# Patient Record
Sex: Male | Born: 1995 | Race: Black or African American | Hispanic: No | Marital: Single | Smoking: Never smoker
Health system: Southern US, Community
[De-identification: ages and names within clinical notes are randomized; demographics above are authoritative.]

## PROBLEM LIST (undated history)

## (undated) ENCOUNTER — Emergency Department (HOSPITAL_COMMUNITY): Admission: EM | Payer: Medicaid Other | Source: Home / Self Care

## (undated) DIAGNOSIS — J45909 Unspecified asthma, uncomplicated: Secondary | ICD-10-CM

## (undated) DIAGNOSIS — J302 Other seasonal allergic rhinitis: Secondary | ICD-10-CM

## (undated) DIAGNOSIS — F419 Anxiety disorder, unspecified: Secondary | ICD-10-CM

## (undated) HISTORY — PX: NO PAST SURGERIES: SHX2092

---
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
## 2015-03-29 ENCOUNTER — Emergency Department (HOSPITAL_COMMUNITY)
Admission: EM | Admit: 2015-03-29 | Discharge: 2015-03-29 | Disposition: A | Discharge status: Home / Self Care | Payer: Self-pay | Attending: Emergency Medicine | Admitting: Emergency Medicine

## 2015-03-29 ENCOUNTER — Encounter (HOSPITAL_COMMUNITY): Payer: Self-pay | Admitting: Emergency Medicine

## 2015-03-29 DIAGNOSIS — M62838 Other muscle spasm: Secondary | ICD-10-CM | POA: Insufficient documentation

## 2015-03-29 DIAGNOSIS — M549 Dorsalgia, unspecified: Secondary | ICD-10-CM

## 2015-03-29 DIAGNOSIS — S4992XA Unspecified injury of left shoulder and upper arm, initial encounter: Secondary | ICD-10-CM | POA: Insufficient documentation

## 2015-03-29 DIAGNOSIS — Y92410 Unspecified street and highway as the place of occurrence of the external cause: Secondary | ICD-10-CM | POA: Insufficient documentation

## 2015-03-29 DIAGNOSIS — Z79899 Other long term (current) drug therapy: Secondary | ICD-10-CM | POA: Insufficient documentation

## 2015-03-29 DIAGNOSIS — Y9389 Activity, other specified: Secondary | ICD-10-CM | POA: Insufficient documentation

## 2015-03-29 DIAGNOSIS — S199XXA Unspecified injury of neck, initial encounter: Secondary | ICD-10-CM | POA: Insufficient documentation

## 2015-03-29 DIAGNOSIS — Z880 Allergy status to penicillin: Secondary | ICD-10-CM | POA: Insufficient documentation

## 2015-03-29 DIAGNOSIS — S3992XA Unspecified injury of lower back, initial encounter: Secondary | ICD-10-CM | POA: Insufficient documentation

## 2015-03-29 DIAGNOSIS — Y998 Other external cause status: Secondary | ICD-10-CM | POA: Insufficient documentation

## 2015-03-29 MED ORDER — METHOCARBAMOL 500 MG PO TABS: 500.0000 mg | ORAL_TABLET | Freq: Two times a day (BID) | ORAL | Status: DC

## 2015-03-29 MED ORDER — NAPROXEN 500 MG PO TABS
500.0000 mg | ORAL_TABLET | Freq: Two times a day (BID) | ORAL | Status: DC
Start: 2015-03-29 — End: 2016-09-25
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
## 2016-02-01 ENCOUNTER — Emergency Department (HOSPITAL_COMMUNITY): Payer: Self-pay

## 2016-02-01 ENCOUNTER — Encounter (HOSPITAL_COMMUNITY): Payer: Self-pay | Admitting: Emergency Medicine

## 2016-02-01 DIAGNOSIS — F509 Eating disorder, unspecified: Secondary | ICD-10-CM | POA: Insufficient documentation

## 2016-02-01 DIAGNOSIS — R197 Diarrhea, unspecified: Secondary | ICD-10-CM | POA: Insufficient documentation

## 2016-02-01 DIAGNOSIS — R1110 Vomiting, unspecified: Secondary | ICD-10-CM | POA: Insufficient documentation

## 2016-02-01 LAB — URINALYSIS, ROUTINE W REFLEX MICROSCOPIC
BILIRUBIN URINE: NEGATIVE
GLUCOSE, UA: NEGATIVE mg/dL
HGB URINE DIPSTICK: NEGATIVE
KETONES UR: 40 mg/dL — AB
Leukocytes, UA: NEGATIVE
Nitrite: NEGATIVE
PH: 7 (ref 5.0–8.0)
Protein, ur: NEGATIVE mg/dL
SPECIFIC GRAVITY, URINE: 1.017 (ref 1.005–1.030)

## 2016-02-01 LAB — COMPREHENSIVE METABOLIC PANEL WITH GFR
ALT: 19 U/L (ref 17–63)
AST: 21 U/L (ref 15–41)
Albumin: 4.3 g/dL (ref 3.5–5.0)
Alkaline Phosphatase: 88 U/L (ref 38–126)
Anion gap: 12 (ref 5–15)
BUN: 10 mg/dL (ref 6–20)
CO2: 22 mmol/L (ref 22–32)
Calcium: 9.4 mg/dL (ref 8.9–10.3)
Chloride: 100 mmol/L — ABNORMAL LOW (ref 101–111)
Creatinine, Ser: 1.13 mg/dL (ref 0.61–1.24)
GFR calc Af Amer: >60 mL/min
GFR calc non Af Amer: >60 mL/min
Glucose, Bld: 100 mg/dL — ABNORMAL HIGH (ref 65–99)
Potassium: 3.5 mmol/L (ref 3.5–5.1)
Sodium: 134 mmol/L — ABNORMAL LOW (ref 135–145)
Total Bilirubin: 0.7 mg/dL (ref 0.3–1.2)
Total Protein: 8.4 g/dL — ABNORMAL HIGH (ref 6.5–8.1)

## 2016-02-01 LAB — CBC WITH DIFFERENTIAL/PLATELET
BASOS PCT: 0 %
Basophils Absolute: 0 10*3/uL (ref 0.0–0.1)
Eosinophils Absolute: 0 10*3/uL (ref 0.0–0.7)
Eosinophils Relative: 0 %
HEMATOCRIT: 44.5 % (ref 39.0–52.0)
HEMOGLOBIN: 15.4 g/dL (ref 13.0–17.0)
LYMPHS ABS: 0.9 10*3/uL (ref 0.7–4.0)
LYMPHS PCT: 23 %
MCH: 28.7 pg (ref 26.0–34.0)
MCHC: 34.6 g/dL (ref 30.0–36.0)
MCV: 83 fL (ref 78.0–100.0)
MONO ABS: 0.6 10*3/uL (ref 0.1–1.0)
MONOS PCT: 16 %
NEUTROS ABS: 2.3 10*3/uL (ref 1.7–7.7)
Neutrophils Relative %: 60 %
Platelets: 245 10*3/uL (ref 150–400)
RBC: 5.36 MIL/uL (ref 4.22–5.81)
RDW: 12.9 % (ref 11.5–15.5)
WBC: 3.8 10*3/uL — ABNORMAL LOW (ref 4.0–10.5)

## 2016-02-01 LAB — I-STAT CG4 LACTIC ACID, ED: Lactic Acid, Venous: 1.08 mmol/L (ref 0.5–2.0)

## 2016-02-01 NOTE — ED Notes (Signed)
Dr. Julieanne Cotton. Knott ( EDP ) and E. Manson PasseyBrown RN notified on pt.'s condition , no new order received from EDP

## 2016-02-01 NOTE — ED Notes (Signed)
Pt. presents with multiple complaints : fever , chills , body aches , numbness at torso , emesis and diarrhea onset 3 days ago , febrile at triage Tylenol taken 3 hours prior to arrival . Respirations unlabored.

## 2016-02-02 ENCOUNTER — Emergency Department (HOSPITAL_COMMUNITY)
Admission: EM | Admit: 2016-02-02 | Discharge: 2016-02-02 | Disposition: A | Discharge status: Home / Self Care | Payer: Self-pay | Attending: Emergency Medicine | Admitting: Emergency Medicine

## 2016-02-02 LAB — URINE CULTURE: CULTURE: NO GROWTH

## 2016-02-02 NOTE — ED Notes (Signed)
Pts name called for a room no answer 

## 2016-02-06 LAB — CULTURE, BLOOD (ROUTINE X 2)
CULTURE: NO GROWTH
Culture: NO GROWTH

## 2016-02-26 ENCOUNTER — Encounter (HOSPITAL_COMMUNITY): Payer: Self-pay | Admitting: *Deleted

## 2016-02-26 ENCOUNTER — Emergency Department (HOSPITAL_COMMUNITY)
Admission: EM | Admit: 2016-02-26 | Discharge: 2016-02-26 | Disposition: A | Discharge status: Home / Self Care | Payer: No Typology Code available for payment source | Attending: Emergency Medicine | Admitting: Emergency Medicine

## 2016-02-26 ENCOUNTER — Emergency Department (HOSPITAL_COMMUNITY): Payer: No Typology Code available for payment source

## 2016-02-26 DIAGNOSIS — I319 Disease of pericardium, unspecified: Secondary | ICD-10-CM | POA: Insufficient documentation

## 2016-02-26 DIAGNOSIS — J45909 Unspecified asthma, uncomplicated: Secondary | ICD-10-CM | POA: Insufficient documentation

## 2016-02-26 DIAGNOSIS — Z79899 Other long term (current) drug therapy: Secondary | ICD-10-CM | POA: Insufficient documentation

## 2016-02-26 DIAGNOSIS — Z791 Long term (current) use of non-steroidal anti-inflammatories (NSAID): Secondary | ICD-10-CM | POA: Insufficient documentation

## 2016-02-26 DIAGNOSIS — Z880 Allergy status to penicillin: Secondary | ICD-10-CM | POA: Insufficient documentation

## 2016-02-26 HISTORY — DX: Unspecified asthma, uncomplicated: J45.909

## 2016-02-26 MED ORDER — IBUPROFEN 800 MG PO TABS: 800.0000 mg | ORAL_TABLET | Freq: Three times a day (TID) | ORAL | Status: DC

## 2016-02-26 MED ORDER — IBUPROFEN 800 MG PO TABS
800.0000 mg | ORAL_TABLET | Freq: Once | ORAL | Status: AC
  Administered 2016-02-26: 800 mg via ORAL
  Filled 2016-02-26: qty 1

## 2016-02-26 MED ORDER — COLCHICINE 0.6 MG PO TABS: 0.6000 mg | ORAL_TABLET | Freq: Two times a day (BID) | ORAL | Status: DC

## 2016-02-26 MED ORDER — OMEPRAZOLE 20 MG PO CPDR: 20.0000 mg | DELAYED_RELEASE_CAPSULE | Freq: Every day | ORAL | Status: DC

## 2016-02-26 NOTE — ED Provider Notes (Signed)
CSN: [id number]     Arrival date & time 02/26/16  1454 History   First MD Initiated Contact with Patient 02/26/16 1655     Chief Complaint  Patient presents with  . Pain     (Consider location/radiation/quality/duration/timing/severity/associated sxs/prior Treatment) HPI Comments: Patient presents to the emergency department with chief complaint of chest pain. He states that he has been having left sided rib/chest pain for the past 1-2 weeks. He states the pain is worsened with deep breathing. It is worsened when he lies down. He states that he recently had the flu 3 weeks ago. He denies any other associated symptoms. Denies any fevers, chills, shortness of breath.   The history is provided by the patient. No language interpreter was used.    Past Medical History  Diagnosis Date  . Seasonal allergies   . Asthma    History reviewed. No pertinent past surgical history. History reviewed. No pertinent family history. Social History  Substance Use Topics  . Smoking status: Never Smoker   . Smokeless tobacco: None  . Alcohol Use: No    Review of Systems  Constitutional: Negative for fever and chills.  Respiratory: Negative for shortness of breath.   Cardiovascular: Negative for chest pain.  Gastrointestinal: Negative for nausea, vomiting, diarrhea and constipation.  Genitourinary: Negative for dysuria.  All other systems reviewed and are negative.     Allergies  Amoxicillin  Home Medications   Prior to Admission medications   Medication Sig Start Date End Date Taking? Authorizing Provider  albuterol (PROVENTIL HFA;VENTOLIN HFA) 108 (90 BASE) MCG/ACT inhaler Inhale 1-2 puffs into the lungs every 6 (six) hours as needed for wheezing or shortness of breath. 07/31/14   Loren Raceravid Yelverton, MD  diphenhydrAMINE (BENADRYL) 25 MG tablet Take 1 tablet (25 mg total) by mouth every 6 (six) hours as needed for itching. 07/31/14   Loren Raceravid Yelverton, MD  methocarbamol (ROBAXIN) 500 MG tablet  Take 1 tablet (500 mg total) by mouth 2 (two) times daily. 03/29/15   Garlon HatchetLisa M Sanders, PA-C  naproxen (NAPROSYN) 500 MG tablet Take 1 tablet (500 mg total) by mouth 2 (two) times daily with a meal. 03/29/15   Garlon HatchetLisa M Sanders, PA-C  predniSONE (DELTASONE) 20 MG tablet 3 tabs po day one, then 2 po daily x 4 days 07/31/14   Loren Raceravid Yelverton, MD   BP 131/72 mmHg  Pulse 88  Temp(Src) 98.6 F (37 C) (Oral)  Resp 16  SpO2 98% Physical Exam  Constitutional: He is oriented to person, place, and time. He appears well-developed and well-nourished.  HENT:  Head: Normocephalic and atraumatic.  Eyes: Conjunctivae and EOM are normal. Pupils are equal, round, and reactive to light. Right eye exhibits no discharge. Left eye exhibits no discharge. No scleral icterus.  Neck: Normal range of motion. Neck supple. No JVD present.  Cardiovascular: Normal rate, regular rhythm and normal heart sounds.  Exam reveals no gallop and no friction rub.   No murmur heard. Pulmonary/Chest: Effort normal and breath sounds normal. No respiratory distress. He has no wheezes. He has no rales. He exhibits no tenderness.  Abdominal: Soft. He exhibits no distension and no mass. There is no tenderness. There is no rebound and no guarding.  Musculoskeletal: Normal range of motion. He exhibits no edema or tenderness.  Neurological: He is alert and oriented to person, place, and time.  Skin: Skin is warm and dry.  Psychiatric: He has a normal mood and affect. His behavior is normal. Judgment and thought content  normal.  Nursing note and vitals reviewed.   ED Course  Procedures (including critical care time)  Imaging Review Dg Chest 2 View  02/26/2016  CLINICAL DATA:  20 year old male with left-sided chest pain EXAM: CHEST  2 VIEW COMPARISON:  None. FINDINGS: The lungs are clear and negative for focal airspace consolidation, pulmonary edema or suspicious pulmonary nodule. No pleural effusion or pneumothorax. Cardiac and mediastinal  contours are within normal limits. No acute fracture or lytic or blastic osseous lesions. The visualized upper abdominal bowel gas pattern is unremarkable. IMPRESSION: Negative chest x-ray. Electronically Signed   By: Malachy Moan M.D.   On: 02/26/2016 16:05   I have personally reviewed and evaluated these images and lab results as part of my medical decision-making.   EKG Interpretation None     ED ECG REPORT  I personally interpreted this EKG   Date: 02/26/2016   Rate: 76  Rhythm: normal sinus rhythm  QRS Axis: normal  Intervals: normal  ST/T Wave abnormalities: ST elevations diffusely  Conduction Disutrbances:none  Narrative Interpretation:   Old EKG Reviewed: none available   MDM   Final diagnoses:  Pericarditis    Patient with symptoms concerning for pericarditis. He has left-sided chest pain that is worsened with deep breathing and when he lies down. Symptoms improved when he sits up. He had recent influenza 3 weeks ago. Will check EKG. Chest x-ray is negative. Plan for treatment with anti-inflammatories. Vital signs are stable.  Patient discussed with Dr. Effie Shy, who agrees with treatment plan for pericarditis.  Patient does not have a PCP and will require close follow-up with cardiology.  Resources given.  Return precautions given.  VSS.  No other medical problems.  Doubt myocarditis.  Will start ibuprofen, colchicine, and omeprazole to protect the stomach.     Roxy Horseman, PA-C 02/26/16 1800  Mancel Bale, MD 02/26/16 [phone]

## 2016-02-26 NOTE — ED Notes (Signed)
Pt reports recent cough, has left side rib pain that increases with breathing, movement and palpation. Airway intact at triage.

## 2016-02-26 NOTE — ED Notes (Signed)
Pt not answering when called to transport to the room

## 2016-02-26 NOTE — Discharge Instructions (Signed)
Pericarditis °Pericarditis is swelling (inflammation) of the pericardium. The pericardium is a thin, double-layered, fluid-filled tissue sac that surrounds the heart. The purpose of the pericardium is to contain the heart in the chest cavity and keep the heart from overexpanding. Different types of pericarditis can occur, such as: °· Acute pericarditis. Inflammation can develop suddenly in acute pericarditis. °· Chronic pericarditis. Inflammation develops gradually and is long-lasting in chronic pericarditis. °· Constrictive pericarditis. In this type of pericarditis, the layers of the pericardium stiffen and develop scar tissue. The scar tissue thickens and sticks together. This makes it difficult for the heart to pump and work as it normally does. °CAUSES  °Pericarditis can be caused from different conditions, such as: °· A bacterial, fungal or viral infection. °· After a heart attack (myocardial infarction). °· After open-heart surgery (coronary bypass graft surgery). °· Auto-immune conditions such as lupus, rheumatoid arthritis or scleroderma. °· Kidney failure. °· Low thyroid condition (hypothyroidism). °· Cancer from another part of the body that has spread (metastasized) to the pericardium. °· Chest injury or trauma. °· After radiation treatment. °· Certain medicines. °SYMPTOMS  °Symptoms of pericarditis can include: °· Chest pain. Chest pain symptoms may increase when laying down and may be relieved when sitting up and leaning forward. °· A chronic, dry cough. °· Heart palpitations. These may feel like rapid, fluttering or pounding heart beats. °· Chest pain may be worse when swallowing. °· Dizziness or fainting. °· Tiredness, fatigue or lethargy. °· Fever. °DIAGNOSIS  °Pericarditis is diagnosed by the following: °· A physical exam. A heart sound called a pericardial friction rub may be heard when your caregiver listens to your heart. °· Blood work. Blood may be drawn to check for an infection and to look at  your blood chemistry. °· Electrocardiography. During electrocardiography your heart's electrical activity is monitored and recorded with a tracing on paper (electrocardiogram [ECG]). °· Echocardiography. °· Computed tomography (CT). °· Magnetic resonance image (MRI). °TREATMENT  °To treat pericarditis, it is important to know the cause of it. The cause of pericarditis determines the treatment.  °· If the cause of pericarditis is due to an infection, treatment is based on the type of infection. If an infection is suspected in the pericardial fluid, a procedure called a pericardial fluid culture and biopsy may be done. This takes a sample of the pericardial fluid. The sample is sent to a lab which runs tests on the pericardial fluid to check for an infection. °· If the autoimmune disease is the cause, treatment of the autoimmune condition will help improve the pericarditis. °· If the cause of pericarditis is not known, anti-inflammatory medicines may be used to help decrease the inflammation. °· Surgery may be needed. The following are types of surgeries or procedures that may be done to treat pericarditis: °¨ Pericardial window. A pericardial window makes a cut (incision) into the pericardial sac. This allows excess fluid in the pericardium to drain. °¨ Pericardiocentesis. A pericardiocentesis is also known as a pericardial tap. This procedure uses a needle that is guided by X-ray to drain (aspirate) excess fluid from the pericardium. °¨ Pericardiectomy. A pericardiectomy removes part or all of the pericardium. °HOME CARE INSTRUCTIONS  °· Do not smoke. If you smoke, quit. Your caregiver can help you quit smoking. °· Maintain a healthy weight. °· Follow an exercise program as directed by your health care provider. You may need to limit your exercising until your symptoms go away. °· If you drink alcohol, do so in moderation. °·   Eat a heart healthy diet. A registered dietitian can help you learn about healthy food  choices. °· Keep a list of all your medicines with you at all times. Include the name, dose, how often it is taken and how it is taken. °SEEK IMMEDIATE MEDICAL CARE IF:  °· You have chest pain or feelings of chest pressure. °· You have sweating (diaphoresis) when at rest. °· You have irregular heartbeats (palpitations). °· You have rapid, racing heart beats. °· You have unexplained fainting episodes. °· You feel sick to your stomach (nausea) or vomiting without cause. °· You have unexplained weakness. °If you develop any of the symptoms which originally made you seek care, call for local emergency medical help. Do not drive yourself to the hospital. °  °This information is not intended to replace advice given to you by your health care provider. Make sure you discuss any questions you have with your health care provider. °  °Document Released: 04/25/2001 Document Revised: 03/16/2015 Document Reviewed: 05/12/2015 °Elsevier Interactive Patient Education ©2016 [employer] ° °

## 2016-09-25 ENCOUNTER — Emergency Department (HOSPITAL_COMMUNITY): Payer: Self-pay

## 2016-09-25 ENCOUNTER — Emergency Department (HOSPITAL_COMMUNITY)
Admission: EM | Admit: 2016-09-25 | Discharge: 2016-09-25 | Disposition: A | Discharge status: Home / Self Care | Payer: Self-pay | Attending: Emergency Medicine | Admitting: Emergency Medicine

## 2016-09-25 ENCOUNTER — Encounter (HOSPITAL_COMMUNITY): Payer: Self-pay | Admitting: Emergency Medicine

## 2016-09-25 DIAGNOSIS — W1839XA Other fall on same level, initial encounter: Secondary | ICD-10-CM | POA: Insufficient documentation

## 2016-09-25 DIAGNOSIS — Y990 Civilian activity done for income or pay: Secondary | ICD-10-CM | POA: Insufficient documentation

## 2016-09-25 DIAGNOSIS — Y929 Unspecified place or not applicable: Secondary | ICD-10-CM | POA: Insufficient documentation

## 2016-09-25 DIAGNOSIS — M25561 Pain in right knee: Secondary | ICD-10-CM

## 2016-09-25 DIAGNOSIS — J45909 Unspecified asthma, uncomplicated: Secondary | ICD-10-CM | POA: Insufficient documentation

## 2016-09-25 DIAGNOSIS — Y9361 Activity, american tackle football: Secondary | ICD-10-CM | POA: Insufficient documentation

## 2016-09-25 DIAGNOSIS — Z79899 Other long term (current) drug therapy: Secondary | ICD-10-CM | POA: Insufficient documentation

## 2016-09-25 MED ORDER — IBUPROFEN 800 MG PO TABS: 800.0000 mg | ORAL_TABLET | Freq: Three times a day (TID) | ORAL | 0 refills | Status: DC

## 2016-09-25 NOTE — ED Triage Notes (Signed)
Pt sts right knee pain after injuring 3 weeks ago

## 2016-09-25 NOTE — ED Provider Notes (Signed)
[facility]-EMERGENCY DEPT Provider Note   CSN: [id number] Arrival date & time: 09/25/16  [id number]  By signing my name below, I, Sonum Patel, attest that this documentation has been prepared under the direction and in the presence of Roxy Horsemanobert Jelene Albano, PA-C. Electronically Signed: Sonum Patel, Neurosurgeoncribe. 09/25/16. 9:53 AM.  History   Chief Complaint Chief Complaint  Patient presents with  . Knee Pain     The history is provided by the patient. No language interpreter was used.     HPI Comments: Bryan LemmaZavaijwuan Kelly is a 20 y.o. male who presents to the Emergency Department complaining of constant right knee pain for the past 1 month after an injury. Patient states he was playing football when he jumped and landed on the right lower extremity, causing him to hyperextended his right knee. He states since then his right leg will often buckle when walking causing him to feel unstable when waling. He states last week his right knee buckled causing his pain to worsen.   Past Medical History:  Diagnosis Date  . Asthma   . Seasonal allergies     There are no active problems to display for this patient.   History reviewed. No pertinent surgical history.     Home Medications    Prior to Admission medications   Medication Sig Start Date End Date Taking? Authorizing Provider  albuterol (PROVENTIL HFA;VENTOLIN HFA) 108 (90 BASE) MCG/ACT inhaler Inhale 1-2 puffs into the lungs every 6 (six) hours as needed for wheezing or shortness of breath. 07/31/14   Loren Raceravid Yelverton, MD  colchicine 0.6 MG tablet Take 1 tablet (0.6 mg total) by mouth 2 (two) times daily. 02/26/16   Roxy Horsemanobert Fouad Taul, PA-C  diphenhydrAMINE (BENADRYL) 25 MG tablet Take 1 tablet (25 mg total) by mouth every 6 (six) hours as needed for itching. 07/31/14   Loren Raceravid Yelverton, MD  ibuprofen (ADVIL,MOTRIN) 800 MG tablet Take 1 tablet (800 mg total) by mouth 3 (three) times daily. 02/26/16   Roxy Horsemanobert Filiberto Wamble, PA-C  methocarbamol (ROBAXIN) 500 MG tablet  Take 1 tablet (500 mg total) by mouth 2 (two) times daily. 03/29/15   Garlon HatchetLisa M Sanders, PA-C  naproxen (NAPROSYN) 500 MG tablet Take 1 tablet (500 mg total) by mouth 2 (two) times daily with a meal. 03/29/15   Garlon HatchetLisa M Sanders, PA-C  omeprazole (PRILOSEC) 20 MG capsule Take 1 capsule (20 mg total) by mouth daily. 02/26/16   Roxy Horsemanobert Paiden Caraveo, PA-C  predniSONE (DELTASONE) 20 MG tablet 3 tabs po day one, then 2 po daily x 4 days 07/31/14   Loren Raceravid Yelverton, MD    Family History History reviewed. No pertinent family history.  Social History Social History  Substance Use Topics  . Smoking status: Never Smoker  . Smokeless tobacco: Not on file  . Alcohol use No     Allergies   Amoxicillin   Review of Systems Review of Systems  Musculoskeletal: Positive for arthralgias and gait problem.  Skin: Negative for wound.  Neurological: Negative for weakness and numbness.     Physical Exam Updated Vital Signs BP 129/90 (BP Location: Right Arm)   Pulse 72   Temp 98.2 F (36.8 C) (Oral)   Resp 20   Ht 6' (1.829 m)   Wt 250 lb (113.4 kg)   SpO2 100%   BMI 33.91 kg/m   Physical Exam  Physical Exam  Constitutional: Pt appears well-developed and well-nourished. No distress.  HENT:  Head: Normocephalic and atraumatic.  Eyes: Conjunctivae are normal.  Neck: Normal range of  motion.  Cardiovascular: Normal rate, regular rhythm and intact distal pulses.   Capillary refill < 3 sec  Pulmonary/Chest: Effort normal and breath sounds normal.  Musculoskeletal: Pt exhibits tenderness to palpation over anteromedial joint line. No bony abnormality of deformity.  Pt exhibits no edema.  ROM: 4/5 on right Neurological: Pt  is alert. Coordination normal.  Sensation 5/5 on right Strength 4/5 on right Skin: Skin is warm and dry. Pt is not diaphoretic.  No tenting of the skin  Psychiatric: Pt has a normal mood and affect.  Nursing note and vitals reviewed.   ED Treatments / Results  DIAGNOSTIC  STUDIES: Oxygen Saturation is 100% on RA, normal by my interpretation.    COORDINATION OF CARE: 9:55 AM Discussed treatment plan with pt at bedside and pt agreed to plan.    Labs (all labs ordered are listed, but only abnormal results are displayed) Labs Reviewed - No data to display  EKG  EKG Interpretation None       Radiology No results found.  Procedures Procedures (including critical care time)  Medications Ordered in ED Medications - No data to display   Initial Impression / Assessment and Plan / ED Course  I have reviewed the triage vital signs and the nursing notes.  Pertinent labs & imaging results that were available during my care of the patient were reviewed by me and considered in my medical decision making (see chart for details).  Clinical Course     Patient X-Ray negative for obvious fracture or dislocation.  Pt advised to follow up with orthopedics. Patient given knee immobilizer and crutches while in ED, conservative therapy recommended and discussed. Patient will be discharged home & is agreeable with above plan. Returns precautions discussed. Pt appears safe for discharge.   Final Clinical Impressions(s) / ED Diagnoses   Final diagnoses:  Acute pain of right knee    New Prescriptions Discharge Medication List as of 09/25/2016 10:23 AM    START taking these medications   Details  ibuprofen (ADVIL,MOTRIN) 800 MG tablet Take 1 tablet (800 mg total) by mouth 3 (three) times daily., Starting Mon 09/25/2016, Print       I personally performed the services described in this documentation, which was scribed in my presence. The recorded information has been reviewed and is accurate.      Roxy HorsemanRobert Krithik Mapel, PA-C 09/25/16 1155    Heide Scaleshristopher J Tegeler, MD 09/25/16 2120

## 2017-01-09 ENCOUNTER — Encounter (HOSPITAL_COMMUNITY): Payer: Self-pay | Admitting: Emergency Medicine

## 2017-01-09 ENCOUNTER — Emergency Department (HOSPITAL_COMMUNITY)
Admission: EM | Admit: 2017-01-09 | Discharge: 2017-01-09 | Disposition: A | Discharge status: Home / Self Care | Payer: Self-pay | Attending: Emergency Medicine | Admitting: Emergency Medicine

## 2017-01-09 DIAGNOSIS — Z79899 Other long term (current) drug therapy: Secondary | ICD-10-CM | POA: Insufficient documentation

## 2017-01-09 DIAGNOSIS — J45909 Unspecified asthma, uncomplicated: Secondary | ICD-10-CM | POA: Insufficient documentation

## 2017-01-09 DIAGNOSIS — R059 Cough, unspecified: Secondary | ICD-10-CM

## 2017-01-09 DIAGNOSIS — R05 Cough: Secondary | ICD-10-CM | POA: Insufficient documentation

## 2017-01-09 NOTE — ED Provider Notes (Signed)
[facility]PT Provider Note   CSN: [id number] Arrival date & time: 01/09/17  1119  By signing my name below, I, Roger Kelly, attest that this documentation has been prepared under the direction and in the presence of Draden Cottingham, [place]. Electronically Signed: Freida Busmaniana Kelly, Scribe. 01/09/2017. 1:06 PM.   History   Chief Complaint Chief Complaint  Patient presents with  . Asthma    The history is provided by the patient. No language interpreter was used.     HPI Comments:  Roger Kelly is a 21 y.o. male with a history of exercise induced asthma,  who presents to the Emergency Department complaining of chest tightness x 3-4 days. He notes associated dry cough that is worse when supine and intermittent episodes of SOB (none at this time). Pt has a h/o similar symptoms secondary to his asthma; states past episodes usually resolve after 1-2 days. He has used his albuterol inhaler without relief.  Pt denies postnasal drip, congestion, rhinorrhea, and sore throat. He also denies h/o GERD. No fever, chills, nausea or diarrhea.    Past Medical History:  Diagnosis Date  . Asthma   . Seasonal allergies     There are no active problems to display for this patient.   History reviewed. No pertinent surgical history.     Home Medications    Prior to Admission medications   Medication Sig Start Date End Date Taking? Authorizing Provider  albuterol (PROVENTIL HFA;VENTOLIN HFA) 108 (90 BASE) MCG/ACT inhaler Inhale 1-2 puffs into the lungs every 6 (six) hours as needed for wheezing or shortness of breath. 07/31/14   Loren Raceravid Yelverton, MD  colchicine 0.6 MG tablet Take 1 tablet (0.6 mg total) by mouth 2 (two) times daily. 02/26/16   Roxy Horsemanobert Browning, PA-C  diphenhydrAMINE (BENADRYL) 25 MG tablet Take 1 tablet (25 mg total) by mouth every 6 (six) hours as needed for itching. 07/31/14   Loren Raceravid Yelverton, MD  ibuprofen (ADVIL,MOTRIN) 800 MG tablet Take 1 tablet (800 mg total) by mouth  3 (three) times daily. 09/25/16   Roxy Horsemanobert Browning, PA-C  methocarbamol (ROBAXIN) 500 MG tablet Take 1 tablet (500 mg total) by mouth 2 (two) times daily. 03/29/15   Garlon HatchetLisa M Sanders, PA-C  omeprazole (PRILOSEC) 20 MG capsule Take 1 capsule (20 mg total) by mouth daily. 02/26/16   Roxy Horsemanobert Browning, PA-C  predniSONE (DELTASONE) 20 MG tablet 3 tabs po day one, then 2 po daily x 4 days 07/31/14   Loren Raceravid Yelverton, MD    Family History History reviewed. No pertinent family history.  Social History Social History  Substance Use Topics  . Smoking status: Never Smoker  . Smokeless tobacco: Never Used  . Alcohol use No     Allergies   Amoxicillin   Review of Systems Review of Systems  Constitutional: Negative for chills and fever.  HENT: Negative for congestion, postnasal drip, rhinorrhea and sore throat.   Respiratory: Positive for cough, chest tightness and shortness of breath.   Gastrointestinal: Negative for abdominal pain, nausea and vomiting.     Physical Exam Updated Vital Signs BP 122/71 (BP Location: Left Arm)   Pulse 82   Temp 98.2 F (36.8 C) (Oral)   Resp 16   Ht 6\' 2"  (1.88 m)   Wt 108.9 kg   SpO2 98%   BMI 30.81 kg/m   Physical Exam  Constitutional: He is oriented to person, place, and time. He appears well-developed and well-nourished. No distress.  Well appearing  HENT:  Head: Normocephalic and  atraumatic.  Right Ear: External ear normal.  Left Ear: External ear normal.  Nose: Nose normal.  Mouth/Throat: Oropharynx is clear and moist. No oropharyngeal exudate.  Oropharynx without evidence of redness or exudates. Tonsils without evidence of redness, swelling, or exudates. TM's appear normal with no evidence of bulging. EAC appear non erythematous and not swollen  Eyes: Conjunctivae and EOM are normal. Pupils are equal, round, and reactive to light.  Neck: Normal range of motion.  Normal ROM. No nuchal rigidity.   Cardiovascular: Normal rate, regular rhythm and  normal heart sounds.   Pulmonary/Chest: Effort normal and breath sounds normal. No respiratory distress. He has no wheezes. He has no rales.  Lungs CTA. No wheezing. No rales. No stridor. Normal work of breathing  Abdominal: Soft. He exhibits no distension. There is no tenderness. There is no rebound and no guarding.  Soft and nontender. No rebound. No guarding. Negative murphy's sign. No focal tenderness at McBurney's point. No CVA tenderness. No evidence of hernia  Neurological: He is alert and oriented to person, place, and time.  Skin: Skin is warm and dry.  Psychiatric: He has a normal mood and affect. His behavior is normal.  Nursing note and vitals reviewed.    ED Treatments / Results  DIAGNOSTIC STUDIES:  Oxygen Saturation is 98% on RA, normal by my interpretation.    COORDINATION OF CARE:  1:03 PM Discussed treatment plan with pt at bedside and pt agreed to plan.  Labs (all labs ordered are listed, but only abnormal results are displayed) Labs Reviewed - No data to display  EKG  EKG Interpretation None       Radiology No results found.  Procedures Procedures (including critical care time)  Medications Ordered in ED Medications - No data to display   Initial Impression / Assessment and Plan / ED Course  I have reviewed the triage vital signs and the nursing notes.  Pertinent labs & imaging results that were available during my care of the patient were reviewed by me and considered in my medical decision making (see chart for details).    Patient here with complaints of cough for 4 days. He states that is mild, random and dry. Worse with lying down. He denies any recent cold-like symptoms. No history of GERD. He denies postnasal drip. He denies symptoms of asthma. He is well-appearing, nontoxic in no apparent distress. Hemodynamically stable. Patient will be discharged with instructions to continue symptomatically treatment at home. He states he has albuterol at  home. I offered him [employer] and he declined.  He states he's had this before for several days and goes away. Patient instructed to follow up with a primary care provider. He currently does not have one and has been given Engineer, building services for proper follow-up.  Reasons to immediately return to the emergency department discussed. Patient is agreeable with assessment and plan. Patient verbalizes understanding.   Final Clinical Impressions(s) / ED Diagnoses   Final diagnoses:  Cough    New Prescriptions New Prescriptions   No medications on file   I personally performed the services described in this documentation, which was scribed in my presence. The recorded information has been reviewed and is accurate.    [address] [place], [place] 01/09/17 1324    Gerhard Munch, MD 01/09/17 2032

## 2017-01-09 NOTE — Discharge Instructions (Signed)
Please schedule appointment with a primary care provider regarding today's visit and 2-5 days.  Contact a health care provider if: You have new symptoms. You cough up pus. Your cough does not get better after 2-3 weeks, or your cough gets worse. You cannot control your cough with suppressant medicines and you are losing sleep. You develop pain that is getting worse or pain that is not controlled with pain medicines. You have a fever. You have unexplained weight loss. You have night sweats. Get help right away if: You cough up blood. You have difficulty breathing. Your heartbeat is very fast.

## 2017-01-09 NOTE — ED Notes (Signed)
Lungs diminished clear.

## 2017-01-09 NOTE — ED Triage Notes (Signed)
Pt from home with c/o SOB with exertion for the last 4 days with hx of the same but was never seen.  Pt states he had exercise induced asthma as a kid.  NAD, denies SOB at this time, ambulatory.

## 2018-01-20 ENCOUNTER — Other Ambulatory Visit: Payer: Self-pay

## 2018-01-20 ENCOUNTER — Emergency Department (HOSPITAL_COMMUNITY)
Admission: EM | Admit: 2018-01-20 | Discharge: 2018-01-20 | Disposition: A | Discharge status: Home / Self Care | Payer: Self-pay | Attending: Emergency Medicine | Admitting: Emergency Medicine

## 2018-01-20 ENCOUNTER — Encounter (HOSPITAL_COMMUNITY): Payer: Self-pay | Admitting: Emergency Medicine

## 2018-01-20 DIAGNOSIS — R110 Nausea: Secondary | ICD-10-CM

## 2018-01-20 DIAGNOSIS — R059 Cough, unspecified: Secondary | ICD-10-CM

## 2018-01-20 DIAGNOSIS — Z79899 Other long term (current) drug therapy: Secondary | ICD-10-CM | POA: Insufficient documentation

## 2018-01-20 DIAGNOSIS — R05 Cough: Secondary | ICD-10-CM | POA: Insufficient documentation

## 2018-01-20 DIAGNOSIS — R0981 Nasal congestion: Secondary | ICD-10-CM | POA: Insufficient documentation

## 2018-01-20 DIAGNOSIS — J45909 Unspecified asthma, uncomplicated: Secondary | ICD-10-CM | POA: Insufficient documentation

## 2018-01-20 DIAGNOSIS — Z209 Contact with and (suspected) exposure to unspecified communicable disease: Secondary | ICD-10-CM | POA: Insufficient documentation

## 2018-01-20 DIAGNOSIS — R112 Nausea with vomiting, unspecified: Secondary | ICD-10-CM | POA: Insufficient documentation

## 2018-01-20 LAB — CBC
HEMATOCRIT: 45.1 % (ref 39.0–52.0)
HEMOGLOBIN: 15.8 g/dL (ref 13.0–17.0)
MCH: 30.4 pg (ref 26.0–34.0)
MCHC: 35 g/dL (ref 30.0–36.0)
MCV: 86.9 fL (ref 78.0–100.0)
Platelets: 338 10*3/uL (ref 150–400)
RBC: 5.19 MIL/uL (ref 4.22–5.81)
RDW: 12.8 % (ref 11.5–15.5)
WBC: 5.8 10*3/uL (ref 4.0–10.5)

## 2018-01-20 LAB — LIPASE, BLOOD: LIPASE: 21 U/L (ref 11–51)

## 2018-01-20 LAB — URINALYSIS, ROUTINE W REFLEX MICROSCOPIC
BILIRUBIN URINE: NEGATIVE
GLUCOSE, UA: NEGATIVE mg/dL
HGB URINE DIPSTICK: NEGATIVE
Ketones, ur: NEGATIVE mg/dL
Leukocytes, UA: NEGATIVE
Nitrite: NEGATIVE
PH: 7 (ref 5.0–8.0)
Protein, ur: NEGATIVE mg/dL
SPECIFIC GRAVITY, URINE: 1.015 (ref 1.005–1.030)

## 2018-01-20 LAB — COMPREHENSIVE METABOLIC PANEL
ALK PHOS: 94 U/L (ref 38–126)
ALT: 16 U/L — ABNORMAL LOW (ref 17–63)
ANION GAP: 10 (ref 5–15)
AST: 15 U/L (ref 15–41)
Albumin: 4.1 g/dL (ref 3.5–5.0)
BUN: <5 mg/dL — ABNORMAL LOW (ref 6–20)
CHLORIDE: 103 mmol/L (ref 101–111)
CO2: 21 mmol/L — AB (ref 22–32)
Calcium: 9.3 mg/dL (ref 8.9–10.3)
Creatinine, Ser: 0.89 mg/dL (ref 0.61–1.24)
GFR calc non Af Amer: >60 mL/min (ref >60)
GLUCOSE: 118 mg/dL — AB (ref 65–99)
Potassium: 3.5 mmol/L (ref 3.5–5.1)
SODIUM: 134 mmol/L — AB (ref 135–145)
Total Bilirubin: 0.3 mg/dL (ref 0.3–1.2)
Total Protein: 7.6 g/dL (ref 6.5–8.1)

## 2018-01-20 MED ORDER — PREDNISONE 10 MG (21) PO TBPK: ORAL_TABLET | ORAL | 0 refills | Status: DC

## 2018-01-20 MED ORDER — BENZONATATE 100 MG PO CAPS: 100.0000 mg | ORAL_CAPSULE | Freq: Three times a day (TID) | ORAL | 0 refills | Status: DC

## 2018-01-20 MED ORDER — ONDANSETRON 4 MG PO TBDP: 4.0000 mg | ORAL_TABLET | Freq: Three times a day (TID) | ORAL | 0 refills | Status: DC | PRN

## 2018-01-20 NOTE — Discharge Instructions (Addendum)
Your symptoms are consistent with a viral illness. Viruses do not require antibiotics. Treatment is symptomatic care and it is important to note that these symptoms may last for 7-14 days.   Hand washing: Wash your hands throughout the day, but especially before and after touching the face, using the restroom, sneezing, coughing, or touching surfaces that have been coughed or sneezed upon. Hydration: Symptoms will be intensified and complicated by dehydration. Dehydration can also extend the duration of symptoms. Drink plenty of fluids and get plenty of rest. You should be drinking at least half a liter of water an hour to stay hydrated. Electrolyte drinks (ex. Gatorade, Powerade, Pedialyte) are also encouraged. You should be drinking enough fluids to make your urine light yellow, almost clear. If this is not the case, you are not drinking enough water. Please note that some of the treatments indicated below will not be effective if you are not adequately hydrated. Diet: Please concentrate on hydration, however, you may introduce food slowly.  Start with a clear liquid diet, progressed to a full liquid diet, and then bland solids as you are able. Pain or fever: Ibuprofen, Naproxen, or Tylenol for pain or fever.  Nausea/vomiting: Use the Zofran for nausea or vomiting.  Cough: Use the Tessalon for cough.  Albuterol: May use the albuterol as needed for instances of shortness of breath. Prednisone: Take the prednisone, as directed, in its entirety. Zyrtec or Claritin: May add these medication daily to control underlying symptoms of congestion, sneezing, and other signs of allergies. Flonase: Use this medication, as directed, for nasal and sinus congestion. Congestion: Plain Mucinex may help relieve congestion. Saline sinus rinses and saline nasal sprays may also help relieve congestion. If you do not have heart problems or an allergy to such medications, you may also try phenylephrine or Sudafed. Sore  throat: Warm liquids or Chloraseptic spray may help soothe a sore throat. Gargle twice a day with a salt water solution made from a half teaspoon of salt in a cup of warm water.  Follow up: Follow up with a primary care provider, as needed, for any future management of this issue.

## 2018-01-20 NOTE — ED Provider Notes (Signed)
[facility] EMERGENCY DEPARTMENT Provider Note   CSN: [id number] Arrival date & time: 01/20/18  1055     History   Chief Complaint Chief Complaint  Patient presents with  . Nausea  . Emesis    HPI Roger Kelly is a 22 y.o. male.  HPI   Roger Kelly is a 22 y.o. male, with a history of asthma and seasonal allergies, presenting to the ED with nonproductive cough for last two days. Also endorses nausea and nasal congestion.  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 Denies vomiting, diarrhea, rash, sore throat, chest pain, shortness of breath, abdominal pain, fever/chills, or any other complaints.   Uses his inhaler once a day or every other day.  States he does not need a refill.  Past Medical History:  Diagnosis Date  . Asthma   . Seasonal allergies     There are no active problems to display for this patient.   History reviewed. No pertinent surgical history.     Home Medications    Prior to Admission medications   Medication Sig Start Date End Date Taking? Authorizing Provider  albuterol (PROVENTIL HFA;VENTOLIN HFA) 108 (90 BASE) MCG/ACT inhaler Inhale 1-2 puffs into the lungs every 6 (six) hours as needed for wheezing or shortness of breath. 07/31/14   Loren Racer, MD  benzonatate (TESSALON) 100 MG capsule Take 1 capsule (100 mg total) by mouth every 8 (eight) hours. 01/20/18   Taylia Berber C, PA-C  colchicine 0.6 MG tablet Take 1 tablet (0.6 mg total) by mouth 2 (two) times daily. 02/26/16   Roxy Horseman, PA-C  diphenhydrAMINE (BENADRYL) 25 MG tablet Take 1 tablet (25 mg total) by mouth every 6 (six) hours as needed for itching. 07/31/14   Loren Racer, MD  ibuprofen (ADVIL,MOTRIN) 800 MG tablet Take 1 tablet (800 mg total) by mouth 3 (three) times daily. 09/25/16   Roxy Horseman, PA-C  methocarbamol  (ROBAXIN) 500 MG tablet Take 1 tablet (500 mg total) by mouth 2 (two) times daily. 03/29/15   Garlon Hatchet, PA-C  omeprazole (PRILOSEC) 20 MG capsule Take 1 capsule (20 mg total) by mouth daily. 02/26/16   Roxy Horseman, PA-C  ondansetron (ZOFRAN ODT) 4 MG disintegrating tablet Take 1 tablet (4 mg total) by mouth every 8 (eight) hours as needed for nausea or vomiting. 01/20/18   Kayly Kriegel C, PA-C  predniSONE (STERAPRED UNI-PAK 21 TAB) 10 MG (21) TBPK tablet Take 6 tabs (60mg ) on day 1, 5 tabs (50mg ) on day 2, 4 tabs (40mg ) on day 3, 3 tabs (30mg ) on day 4, 2 tabs (20mg ) on day 5, and 1 tab (10mg ) on day 6. 01/20/18   Leylanie Woodmansee, Hillard Danker, PA-C    Family History No family history on file.  Social History Social History   Tobacco Use  . Smoking status: Never Smoker  . Smokeless tobacco: Never Used  Substance Use Topics  . Alcohol use: No  . Drug use: Yes    Types: Marijuana     Allergies   Amoxicillin   Review of Systems Review of Systems  Constitutional: Negative for chills, diaphoresis and fever.  HENT: Positive for congestion and sneezing. Negative for sinus pressure, sinus pain and sore throat.   Respiratory: Positive for cough. Negative for shortness of breath.   Cardiovascular: Negative for chest pain.  Gastrointestinal: Positive for nausea. Negative for abdominal  pain, diarrhea and vomiting.  Neurological: Negative for weakness and headaches.  All other systems reviewed and are negative.    Physical Exam Updated Vital Signs BP 120/77 (BP Location: Right Arm)   Pulse 78   Temp 98.7 F (37.1 C) (Oral)   Resp 16   Ht 6\' 2"  (1.88 m)   Wt 108.9 kg (240 lb)   SpO2 100%   BMI 30.81 kg/m   Physical Exam  Constitutional: He appears well-developed and well-nourished. No distress.  HENT:  Head: Normocephalic and atraumatic.  Eyes: Conjunctivae are normal.  Neck: Neck supple.  Cardiovascular: Normal rate, regular rhythm, normal heart sounds and intact distal pulses.    Pulmonary/Chest: Effort normal and breath sounds normal. No respiratory distress.  No increased work of breathing noted.  Speaks in full sentences without difficulty.  Abdominal: Soft. There is no tenderness. There is no guarding.  Musculoskeletal: He exhibits no edema.  Lymphadenopathy:    He has no cervical adenopathy.  Neurological: He is alert.  Skin: Skin is warm and dry. He is not diaphoretic.  Psychiatric: He has a normal mood and affect. His behavior is normal.  Nursing note and vitals reviewed.    ED Treatments / Results  Labs (all labs ordered are listed, but only abnormal results are displayed) Labs Reviewed  COMPREHENSIVE METABOLIC PANEL - Abnormal; Notable for the following components:      Result Value   Sodium 134 (*)    CO2 21 (*)    Glucose, Bld 118 (*)    BUN <5 (*)    ALT 16 (*)    All other components within normal limits  LIPASE, BLOOD  CBC  URINALYSIS, ROUTINE W REFLEX MICROSCOPIC    EKG  EKG Interpretation None       Radiology No results found.  Procedures Procedures (including critical care time)  Medications Ordered in ED Medications - No data to display   Initial Impression / Assessment and Plan / ED Course  I have reviewed the triage vital signs and the nursing notes.  Pertinent labs & imaging results that were available during my care of the patient were reviewed by me and considered in my medical decision making (see chart for details).     Patient presents with cough, congestion, and nausea.  Suspect lab abnormalities are due to minor dehydration.  This was discussed with the patient. Patient is nontoxic appearing, afebrile, not tachycardic, not tachypneic, not hypotensive, maintains SPO2 of 100% on room air, and is in no apparent distress. PCP follow-up as needed. The patient was given instructions for home care as well as return precautions. Patient voices understanding of these instructions, accepts the plan, and is comfortable  with discharge.   Vitals:   01/20/18 1108 01/20/18 1300  BP: 120/77 104/86  Pulse: 78 69  Resp: 16   Temp: 98.7 F (37.1 C)   TempSrc: Oral   SpO2: 100% 100%  Weight: 108.9 kg (240 lb)   Height: 6\' 2"  (1.88 m)      Final Clinical Impressions(s) / ED Diagnoses   Final diagnoses:  Cough  Nausea    ED Discharge Orders        Ordered    benzonatate (TESSALON) 100 MG capsule  Every 8 hours     01/20/18 1323    ondansetron (ZOFRAN ODT) 4 MG disintegrating tablet  Every 8 hours PRN     01/20/18 1323    predniSONE (STERAPRED UNI-PAK 21 TAB) 10 MG (21) TBPK tablet  01/20/18 1323       Anselm PancoastJoy, Dezaray Shibuya C, PA-C 01/20/18 1325    Azalia Bilisampos, Kevin, MD 01/21/18 1202

## 2018-01-20 NOTE — ED Triage Notes (Signed)
Pt. Stated, Roger Atlasve been sick for 2 days vomiting and nausea. I forced myself to eat yesterday abut then I vomited

## 2018-04-13 ENCOUNTER — Emergency Department (HOSPITAL_COMMUNITY): Payer: Self-pay

## 2018-04-13 ENCOUNTER — Other Ambulatory Visit: Payer: Self-pay

## 2018-04-13 ENCOUNTER — Encounter (HOSPITAL_COMMUNITY): Payer: Self-pay | Admitting: Emergency Medicine

## 2018-04-13 ENCOUNTER — Emergency Department (HOSPITAL_COMMUNITY)
Admission: EM | Admit: 2018-04-13 | Discharge: 2018-04-13 | Disposition: A | Discharge status: Home / Self Care | Payer: Self-pay | Attending: Emergency Medicine | Admitting: Emergency Medicine

## 2018-04-13 DIAGNOSIS — Z5321 Procedure and treatment not carried out due to patient leaving prior to being seen by health care provider: Secondary | ICD-10-CM | POA: Insufficient documentation

## 2018-04-13 DIAGNOSIS — R079 Chest pain, unspecified: Secondary | ICD-10-CM | POA: Insufficient documentation

## 2018-04-13 LAB — CBC
HCT: 42.4 % (ref 39.0–52.0)
Hemoglobin: 15 g/dL (ref 13.0–17.0)
MCH: 29.4 pg (ref 26.0–34.0)
MCHC: 35.4 g/dL (ref 30.0–36.0)
MCV: 83 fL (ref 78.0–100.0)
Platelets: 358 10*3/uL (ref 150–400)
RBC: 5.11 MIL/uL (ref 4.22–5.81)
RDW: 12.7 % (ref 11.5–15.5)
WBC: 14.6 10*3/uL — ABNORMAL HIGH (ref 4.0–10.5)

## 2018-04-13 LAB — I-STAT TROPONIN, ED: TROPONIN I, POC: 0.01 ng/mL (ref 0.00–0.08)

## 2018-04-13 LAB — BASIC METABOLIC PANEL
Anion gap: 13 (ref 5–15)
BUN: 7 mg/dL (ref 6–20)
CALCIUM: 9.8 mg/dL (ref 8.9–10.3)
CO2: 21 mmol/L — ABNORMAL LOW (ref 22–32)
Chloride: 102 mmol/L (ref 101–111)
Creatinine, Ser: 1.04 mg/dL (ref 0.61–1.24)
GFR calc Af Amer: >60 mL/min (ref >60)
Glucose, Bld: 120 mg/dL — ABNORMAL HIGH (ref 65–99)
Potassium: 3.2 mmol/L — ABNORMAL LOW (ref 3.5–5.1)
Sodium: 136 mmol/L (ref 135–145)

## 2018-04-13 MED ORDER — ALBUTEROL SULFATE (2.5 MG/3ML) 0.083% IN NEBU
5.0000 mg | INHALATION_SOLUTION | Freq: Once | RESPIRATORY_TRACT | Status: AC
  Administered 2018-04-13: 5 mg via RESPIRATORY_TRACT
  Filled 2018-04-13: qty 6

## 2018-04-13 NOTE — ED Notes (Signed)
Patient called for vital signs re-check, no answer.  

## 2018-04-13 NOTE — ED Triage Notes (Signed)
Reports waking from sleep with central chest pressure, sob, nausea.  Hx of asthma.  Also c/o his whole body feeling numb.

## 2018-04-13 NOTE — ED Notes (Signed)
No answer when called for room x 3 

## 2018-04-13 NOTE — ED Notes (Signed)
No answer when called for treatment room x1 

## 2018-04-15 NOTE — ED Notes (Signed)
Follow up call made  No answer   1100  04/15/18  s Lowry Bala rn

## 2019-04-17 ENCOUNTER — Other Ambulatory Visit: Payer: Self-pay

## 2019-04-17 ENCOUNTER — Emergency Department (HOSPITAL_COMMUNITY)
Admission: EM | Admit: 2019-04-17 | Discharge: 2019-04-17 | Discharge status: Against Medical Advice | Payer: Self-pay | Attending: Emergency Medicine | Admitting: Emergency Medicine

## 2019-04-17 ENCOUNTER — Encounter (HOSPITAL_COMMUNITY): Payer: Self-pay | Admitting: *Deleted

## 2019-04-17 DIAGNOSIS — Z5321 Procedure and treatment not carried out due to patient leaving prior to being seen by health care provider: Secondary | ICD-10-CM | POA: Insufficient documentation

## 2019-04-17 HISTORY — DX: Anxiety disorder, unspecified: F41.9

## 2019-04-17 LAB — BASIC METABOLIC PANEL
Anion gap: 9 (ref 5–15)
BUN: <5 mg/dL — ABNORMAL LOW (ref 6–20)
CO2: 23 mmol/L (ref 22–32)
Calcium: 10.1 mg/dL (ref 8.9–10.3)
Chloride: 106 mmol/L (ref 98–111)
Creatinine, Ser: 0.98 mg/dL (ref 0.61–1.24)
GFR calc Af Amer: >60 mL/min (ref >60)
GFR calc non Af Amer: >60 mL/min (ref >60)
Glucose, Bld: 96 mg/dL (ref 70–99)
Potassium: 3.6 mmol/L (ref 3.5–5.1)
Sodium: 138 mmol/L (ref 135–145)

## 2019-04-17 LAB — CBC
HCT: 47.7 % (ref 39.0–52.0)
Hemoglobin: 16.2 g/dL (ref 13.0–17.0)
MCH: 29.9 pg (ref 26.0–34.0)
MCHC: 34 g/dL (ref 30.0–36.0)
MCV: 88 fL (ref 80.0–100.0)
Platelets: 311 10*3/uL (ref 150–400)
RBC: 5.42 MIL/uL (ref 4.22–5.81)
RDW: 13.1 % (ref 11.5–15.5)
WBC: 6.9 10*3/uL (ref 4.0–10.5)
nRBC: 0 % (ref 0.0–0.2)

## 2019-04-17 NOTE — ED Triage Notes (Signed)
Per EMS, pt had syncopal episode possibly d/t anxiety.  He was transported from work.  Syncope was witnessed by a by stander.  He is A&O x 4.  Hx of anxiety and syncope in the past per his family.  Pt's mother reported pt have been under a lot of stress recently.  Reports thoracic pain.  C-collar in place per EMS.  ROM + in all extremities.

## 2019-04-17 NOTE — ED Notes (Signed)
Pt states he wants to leave, his mother is on her way to pick him  Up.  He's made aware that he has not been medically cleared to be discharged.  Pt verbalized understanding.

## 2019-05-09 ENCOUNTER — Emergency Department (HOSPITAL_COMMUNITY): Payer: Self-pay

## 2019-05-09 ENCOUNTER — Emergency Department (HOSPITAL_COMMUNITY)
Admission: EM | Admit: 2019-05-09 | Discharge: 2019-05-09 | Disposition: A | Discharge status: Home / Self Care | Payer: Self-pay | Attending: Emergency Medicine | Admitting: Emergency Medicine

## 2019-05-09 ENCOUNTER — Other Ambulatory Visit: Payer: Self-pay

## 2019-05-09 ENCOUNTER — Encounter (HOSPITAL_COMMUNITY): Payer: Self-pay

## 2019-05-09 DIAGNOSIS — S62309A Unspecified fracture of unspecified metacarpal bone, initial encounter for closed fracture: Secondary | ICD-10-CM

## 2019-05-09 DIAGNOSIS — Y998 Other external cause status: Secondary | ICD-10-CM | POA: Insufficient documentation

## 2019-05-09 DIAGNOSIS — S62304A Unspecified fracture of fourth metacarpal bone, right hand, initial encounter for closed fracture: Secondary | ICD-10-CM | POA: Insufficient documentation

## 2019-05-09 DIAGNOSIS — Y939 Activity, unspecified: Secondary | ICD-10-CM | POA: Insufficient documentation

## 2019-05-09 DIAGNOSIS — W228XXA Striking against or struck by other objects, initial encounter: Secondary | ICD-10-CM | POA: Insufficient documentation

## 2019-05-09 DIAGNOSIS — J45909 Unspecified asthma, uncomplicated: Secondary | ICD-10-CM | POA: Insufficient documentation

## 2019-05-09 DIAGNOSIS — S62306A Unspecified fracture of fifth metacarpal bone, right hand, initial encounter for closed fracture: Secondary | ICD-10-CM | POA: Insufficient documentation

## 2019-05-09 DIAGNOSIS — Y929 Unspecified place or not applicable: Secondary | ICD-10-CM | POA: Insufficient documentation

## 2019-05-09 DIAGNOSIS — F1210 Cannabis abuse, uncomplicated: Secondary | ICD-10-CM | POA: Insufficient documentation

## 2019-05-09 MED ORDER — OXYCODONE-ACETAMINOPHEN 5-325 MG PO TABS
2.0000 | ORAL_TABLET | Freq: Once | ORAL | Status: AC
  Administered 2019-05-09: 03:00:00 2 via ORAL
  Filled 2019-05-09: qty 2

## 2019-05-09 MED ORDER — OXYCODONE-ACETAMINOPHEN 5-325 MG PO TABS: 1.0000 | ORAL_TABLET | Freq: Four times a day (QID) | ORAL | 0 refills | Status: DC | PRN

## 2019-05-09 NOTE — ED Triage Notes (Signed)
Pt reports he was in an argument and swinging his arms around. Swung his right hand into a door. Hand appears swollen and sore.

## 2019-05-09 NOTE — ED Provider Notes (Signed)
Emergency Department Provider Note   I have reviewed the triage vital signs and the nursing notes.   HISTORY  Chief Complaint Hand Injury   HPI Roger Kelly is a 23 y.o. male w/o significant PMHx presents with right hand pain for the last couple hours after hitting it on a door frame while swinging arms around. No trauma elsewhere. Denies hitting person and no open skin   No other associated or modifying symptoms.    Past Medical History:  Diagnosis Date  . Anxiety   . Asthma   . Seasonal allergies     There are no active problems to display for this patient.   History reviewed. No pertinent surgical history.  Current Outpatient Rx  . Order #: [id number] Class: Print  . Order #: [id number] Class: Print  . Order #: [id number] Class: Print  . Order #: [id number] Class: Print  . Order #: [id number] Class: Print  . Order #: [id number] Class: Print  . Order #: [id number] Class: Print  . Order #: [id number] Class: Print  . Order #: [id number] Class: Print  . Order #: [id number] Class: Print    Allergies Amoxicillin  History reviewed. No pertinent family history.  Social History Social History   Tobacco Use  . Smoking status: Never Smoker  . Smokeless tobacco: Never Used  Substance Use Topics  . Alcohol use: No  . Drug use: Yes    Types: Marijuana    Review of Systems  All other systems negative except as documented in the HPI. All pertinent positives and negatives as reviewed in the HPI. ____________________________________________   PHYSICAL EXAM:  VITAL SIGNS: ED Triage Vitals  Enc Vitals Group     BP 05/09/19 0210 (!) 149/84     Pulse Rate 05/09/19 0210 77     Resp 05/09/19 0210 16     Temp 05/09/19 0210 99.1 F (37.3 C)     Temp src --      SpO2 05/09/19 0210 100 %    Constitutional: Alert and oriented. Well appearing and in no acute distress. Eyes: Conjunctivae are normal. PERRL. EOMI. Head: Atraumatic. Nose: No congestion/rhinnorhea.  Mouth/Throat: Mucous membranes are moist.  Oropharynx non-erythematous. Neck: No stridor.  No meningeal signs.   Cardiovascular: Normal rate, regular rhythm. Good peripheral circulation. Grossly normal heart sounds.   Respiratory: Normal respiratory effort.  No retractions. Lungs CTAB. Gastrointestinal: Soft and nontender. No distention.  Musculoskeletal: diffuse swelling, ttp and decreased ROM of right 4/5 MC areas.  Neurologic:  Normal speech and language. No gross focal neurologic deficits are appreciated.  Skin:  Skin is warm, dry and intact. No rash noted.   ____________________________________________   RADIOLOGY  Dg Hand Complete Right  Result Date: 05/09/2019 CLINICAL DATA:  Right hand slammed in door. EXAM: RIGHT HAND - COMPLETE 3+ VIEW COMPARISON:  None. FINDINGS: There are transverse fractures of the proximal fourth and fifth metacarpals. Both fractures are minimally displaced and extra-articular. IMPRESSION: Transverse, extra-articular, minimally displaced fractures of the proximal fourth and fifth metacarpals. Electronically Signed   By: Deatra RobinsonKevin  Herman M.D.   On: 05/09/2019 03:05    ____________________________________________     INITIAL IMPRESSION / ASSESSMENT AND PLAN / ED COURSE  Pain meds. xr.  xr confirmed mc fx. Pain meds. Splint. Hand fu.     Pertinent labs & imaging results that were available during my care of the patient were reviewed by me and considered in my medical decision making (see chart for details).  A medical screening exam was performed and I feel the patient has  had an appropriate workup for their chief complaint at this time and likelihood of emergent condition existing is low. They have been counseled on decision, discharge, follow up and which symptoms necessitate immediate return to the emergency department. They or their family verbally stated understanding and agreement with plan and discharged in stable condition.    ____________________________________________  FINAL CLINICAL IMPRESSION(S) / ED DIAGNOSES  Final diagnoses:  Multiple closed fractures of single metacarpal bone, initial encounter     MEDICATIONS GIVEN DURING THIS VISIT:  Medications  oxyCODONE-acetaminophen (PERCOCET/ROXICET) 5-325 MG per tablet 2 tablet (2 tablets Oral Given 05/09/19 0241)     NEW OUTPATIENT MEDICATIONS STARTED DURING THIS VISIT:  Discharge Medication List as of 05/09/2019  3:14 AM    START taking these medications   Details  oxyCODONE-acetaminophen (PERCOCET) 5-325 MG tablet Take 1 tablet by mouth every 6 (six) hours as needed for severe pain., Starting Fri 05/09/2019, Print        Note:  This note was prepared with assistance of Dragon voice recognition software. Occasional wrong-word or sound-a-like substitutions may have occurred due to the inherent limitations of voice recognition software.   Bernhard Koskinen, Corene Cornea, MD 05/09/19 [phone]

## 2019-05-09 NOTE — ED Notes (Signed)
Pt reports his pain is a 3 bu

## 2019-05-09 NOTE — ED Notes (Signed)
Patient verbalizes understanding of discharge instructions. Opportunity for questioning and answers were provided. Armband removed by staff, pt discharged from ED home via POV, pt's called him mother due to narcotic administration.

## 2019-05-09 NOTE — ED Notes (Signed)
Patient transported to X-ray 

## 2019-05-14 ENCOUNTER — Other Ambulatory Visit: Payer: Self-pay

## 2019-05-14 ENCOUNTER — Encounter (HOSPITAL_BASED_OUTPATIENT_CLINIC_OR_DEPARTMENT_OTHER): Payer: Self-pay | Admitting: *Deleted

## 2019-05-14 ENCOUNTER — Other Ambulatory Visit: Payer: Self-pay | Admitting: Orthopedic Surgery

## 2019-05-16 ENCOUNTER — Other Ambulatory Visit (HOSPITAL_COMMUNITY)
Admission: RE | Admit: 2019-05-16 | Discharge: 2019-05-16 | Disposition: A | Discharge status: Home / Self Care | Payer: Self-pay | Source: Ambulatory Visit | Attending: Orthopedic Surgery | Admitting: Orthopedic Surgery

## 2019-05-16 NOTE — Progress Notes (Signed)
Patient contacted about the need for covid testing to be completed today, left message for patient due to no answer

## 2019-05-19 ENCOUNTER — Ambulatory Visit (HOSPITAL_BASED_OUTPATIENT_CLINIC_OR_DEPARTMENT_OTHER): Admission: RE | Admit: 2019-05-19 | Payer: Self-pay | Source: Home / Self Care | Admitting: Orthopedic Surgery

## 2019-05-19 ENCOUNTER — Encounter (HOSPITAL_COMMUNITY): Payer: Self-pay | Admitting: Certified Registered"

## 2019-05-19 SURGERY — CLOSED REDUCTION, FRACTURE, METACARPAL BONE, WITH PERCUTANEOUS PINNING
Anesthesia: Choice | Laterality: Right

## 2019-05-19 NOTE — Progress Notes (Signed)
I called pt to find out if he was still intending on having surgery today since he had not yet gone for a covid screening. His cousin answered the phone and stated that the pt was in the shower. I asked him to ask the pt what his plan was. Per the cousin, the pt said "I doubt it" when asked about surgery today and would not elaborate. I then asked the pt to call Dr Levell July office to reschedule since we can't allow the pt to come to the surgery center without a negative test. LVM with Elmo Putt at Dr Levell July office with the above information. [facility] director communicated above to Dr Fredna Dow.

## 2020-04-05 ENCOUNTER — Ambulatory Visit (HOSPITAL_COMMUNITY)
Admission: EM | Admit: 2020-04-05 | Discharge: 2020-04-05 | Disposition: A | Discharge status: Home / Self Care | Payer: Self-pay | Attending: Family Medicine | Admitting: Family Medicine

## 2020-04-05 ENCOUNTER — Other Ambulatory Visit: Payer: Self-pay

## 2020-04-05 ENCOUNTER — Encounter (HOSPITAL_COMMUNITY): Payer: Self-pay | Admitting: Emergency Medicine

## 2020-04-05 ENCOUNTER — Ambulatory Visit (INDEPENDENT_AMBULATORY_CARE_PROVIDER_SITE_OTHER): Payer: Self-pay

## 2020-04-05 DIAGNOSIS — S62314A Displaced fracture of base of fourth metacarpal bone, right hand, initial encounter for closed fracture: Secondary | ICD-10-CM

## 2020-04-05 DIAGNOSIS — S62316A Displaced fracture of base of fifth metacarpal bone, right hand, initial encounter for closed fracture: Secondary | ICD-10-CM

## 2020-04-05 DIAGNOSIS — M79644 Pain in right finger(s): Secondary | ICD-10-CM

## 2020-04-05 DIAGNOSIS — M79641 Pain in right hand: Secondary | ICD-10-CM

## 2020-04-05 DIAGNOSIS — S62144A Nondisplaced fracture of body of hamate [unciform] bone, right wrist, initial encounter for closed fracture: Secondary | ICD-10-CM

## 2020-04-05 MED ORDER — IBUPROFEN 800 MG PO TABS: 800.0000 mg | ORAL_TABLET | Freq: Three times a day (TID) | ORAL | 0 refills | Status: DC

## 2020-04-05 NOTE — Discharge Instructions (Addendum)
You have fractures to the fourth and fifth metacarpals of the hand.  Similar to previous fractures you have before. You also in addition have a third small fracture to the hamate bone in the hand We are placing you in a splint to stabilize.  You need to rest, ice and elevate 800 mg ibuprofen for pain every 8 hours as needed You will need to follow-up with hand specialist for further management.  We have given this information on your discharge instructions.

## 2020-04-05 NOTE — Progress Notes (Signed)
Orthopedic Tech Progress Note Patient Details:  Roger Kelly 1996-06-24 [id number]  Ortho Devices Type of Ortho Device: Ulna gutter splint Ortho Device/Splint Location: URE Ortho Device/Splint Interventions: Application, Ordered   Post Interventions Patient Tolerated: Well   Samyak Sackmann A Mabel Roll 04/05/2020, 2:54 PM

## 2020-04-05 NOTE — ED Triage Notes (Addendum)
Pt c/o right little finger pain. Pt states he was moving some boxes this morning and felt a pop. Pt has previous injury in that same finger.

## 2020-04-06 NOTE — ED Provider Notes (Signed)
[facility]    CSN: [id number] Arrival date & time: 04/05/20  1211      History   Chief Complaint Chief Complaint  Patient presents with  . Finger Injury    HPI Roger Kelly is a 24 y.o. male.   Patient is a 24 year old male presents today for hand injury.  Reporting that he was moving some boxes this morning and felt a pop in the right little finger and started having swelling in the right hand near the fourth and fifth metacarpals.  He has had injury to his hand previously.  He never followed up for saw a specialist after having fourth and fifth metacarpal fractures.  This was approximately a year ago.  He has not done anything to treat this new injury.  Denies any numbness, tingling or loss of sensation.  ROS per HPI      Past Medical History:  Diagnosis Date  . Anxiety   . Asthma   . Seasonal allergies     There are no problems to display for this patient.   Past Surgical History:  Procedure Laterality Date  . NO PAST SURGERIES         Home Medications    Prior to Admission medications   Medication Sig Start Date End Date Taking? Authorizing Provider  ibuprofen (ADVIL) 800 MG tablet Take 1 tablet (800 mg total) by mouth 3 (three) times daily. 04/05/20   Dahlia Byes A, NP  albuterol (PROVENTIL HFA;VENTOLIN HFA) 108 (90 BASE) MCG/ACT inhaler Inhale 1-2 puffs into the lungs every 6 (six) hours as needed for wheezing or shortness of breath. 07/31/14 04/05/20  Loren Racer, MD    Family History History reviewed. No pertinent family history.  Social History Social History   Tobacco Use  . Smoking status: Never Smoker  . Smokeless tobacco: Never Used  Substance Use Topics  . Alcohol use: No  . Drug use: Yes    Types: Marijuana    Comment: occas     Allergies   Penicillins and Amoxicillin   Review of Systems Review of Systems   Physical Exam Triage Vital Signs ED Triage Vitals  Enc Vitals Group     BP 04/05/20 1259 (!)  141/74     Pulse Rate 04/05/20 1259 77     Resp 04/05/20 1259 16     Temp 04/05/20 1259 98.2 F (36.8 C)     Temp Source 04/05/20 1259 Oral     SpO2 04/05/20 1259 100 %     Weight --      Height --      Head Circumference --      Peak Flow --      Pain Score 04/05/20 1301 7     Pain Loc --      Pain Edu? --      Excl. in GC? --    No data found.  Updated Vital Signs BP (!) 141/74 (BP Location: Left Arm)   Pulse 77   Temp 98.2 F (36.8 C) (Oral)   Resp 16   SpO2 100%   Visual Acuity Right Eye Distance:   Left Eye Distance:   Bilateral Distance:    Right Eye Near:   Left Eye Near:    Bilateral Near:     Physical Exam Vitals and nursing note reviewed.  Constitutional:      Appearance: Normal appearance.  HENT:     Head: Normocephalic and atraumatic.     Nose: Nose normal.  Eyes:  Conjunctiva/sclera: Conjunctivae normal.  Pulmonary:     Effort: Pulmonary effort is normal.  Musculoskeletal:        General: Normal range of motion.       Hands:     Cervical back: Normal range of motion.     Comments: TTP and swelling. Limited ROM.  Neurovascularly intact  Skin:    General: Skin is warm and dry.  Neurological:     Mental Status: He is alert.  Psychiatric:        Mood and Affect: Mood normal.      UC Treatments / Results  Labs (all labs ordered are listed, but only abnormal results are displayed) Labs Reviewed - No data to display  EKG   Radiology DG Hand Complete Right  Result Date: 04/05/2020 CLINICAL DATA:  Right hand pain after moving some boxes this morning and felt a pop. Constant pain. Pain more on the 5th digit proximal phalanx and metacarpal. Previous fx. No surgery. EXAM: RIGHT HAND - COMPLETE 3+ VIEW COMPARISON:  None. FINDINGS: There are mildly angulated and impacted fractures of the bases of the fourth and fifth metacarpals. No evidence of dislocation. There is a small amount of bony irregularity on frontal view at the lateral aspect of  the hamate bone raising concern for a small fracture. There is dorsal lateral soft tissue swelling. IMPRESSION: 1. Mildly angulated and impacted fractures of the bases of the fourth and fifth metacarpals. 2. Subtle bony irregularity at the lateral aspect of the hamate bone may represent a small fracture. Electronically Signed   By: Emmaline Kluver M.D.   On: 04/05/2020 13:44    Procedures Procedures (including critical care time)  Medications Ordered in UC Medications - No data to display  Initial Impression / Assessment and Plan / UC Course  I have reviewed the triage vital signs and the nursing notes.  Pertinent labs & imaging results that were available during my care of the patient were reviewed by me and considered in my medical decision making (see chart for details).     Fracture of base of fourth and fifth metacarpals and fracture of hamate bone of right wrist. Placing in ulnar gutter splint Rest, ice, elevate Ibuprofen and milligrams every 8 hours for pain as needed. Contact given for follow-up for orthopedic specialist to follow-up in the next week Final Clinical Impressions(s) / UC Diagnoses   Final diagnoses:  Closed displaced fracture of base of fourth metacarpal bone of right hand, initial encounter  Closed displaced fracture of base of fifth metacarpal bone of right hand, initial encounter  Finger pain, right  Closed nondisplaced fracture of hamate bone of right wrist, unspecified portion of hamate, initial encounter     Discharge Instructions     You have fractures to the fourth and fifth metacarpals of the hand.  Similar to previous fractures you have before. You also in addition have a third small fracture to the hamate bone in the hand We are placing you in a splint to stabilize.  You need to rest, ice and elevate 800 mg ibuprofen for pain every 8 hours as needed You will need to follow-up with hand specialist for further management.  We have given this  information on your discharge instructions.     ED Prescriptions    Medication Sig Dispense Auth. Provider   ibuprofen (ADVIL) 800 MG tablet Take 1 tablet (800 mg total) by mouth 3 (three) times daily. 21 tablet Janace Aris, NP  PDMP not reviewed this encounter.   Orvan July, NP 04/06/20 [phone]

## 2020-12-07 ENCOUNTER — Other Ambulatory Visit: Payer: Self-pay

## 2020-12-07 ENCOUNTER — Emergency Department (HOSPITAL_COMMUNITY)
Admission: EM | Admit: 2020-12-07 | Discharge: 2020-12-07 | Disposition: A | Discharge status: Home / Self Care | Payer: Self-pay | Attending: Emergency Medicine | Admitting: Emergency Medicine

## 2020-12-07 ENCOUNTER — Emergency Department (HOSPITAL_COMMUNITY): Payer: Self-pay

## 2020-12-07 ENCOUNTER — Encounter (HOSPITAL_COMMUNITY): Payer: Self-pay | Admitting: Emergency Medicine

## 2020-12-07 DIAGNOSIS — Z5321 Procedure and treatment not carried out due to patient leaving prior to being seen by health care provider: Secondary | ICD-10-CM | POA: Insufficient documentation

## 2020-12-07 DIAGNOSIS — R059 Cough, unspecified: Secondary | ICD-10-CM | POA: Insufficient documentation

## 2020-12-07 DIAGNOSIS — R079 Chest pain, unspecified: Secondary | ICD-10-CM | POA: Insufficient documentation

## 2020-12-07 DIAGNOSIS — J45909 Unspecified asthma, uncomplicated: Secondary | ICD-10-CM | POA: Insufficient documentation

## 2020-12-07 NOTE — ED Triage Notes (Signed)
Pt reports central chest pain off and on since December, states pain worse with palpation or movement. Endorses occasional productive cough. Hx of asthma, resp e/u, nad.

## 2020-12-07 NOTE — ED Notes (Signed)
Pt didn't answer when called for vitals  °

## 2021-02-01 IMAGING — DX DG HAND COMPLETE 3+V*R*
3 series · 3 of 3 positions shown · non-contrast
Comparison: None.

CLINICAL DATA: Right hand pain after moving some boxes this morning
and felt a pop. Constant pain. Pain more on the 5th digit proximal
phalanx and metacarpal. Previous fx. No surgery.

EXAM:
RIGHT HAND - COMPLETE 3+ VIEW

[hand pa]
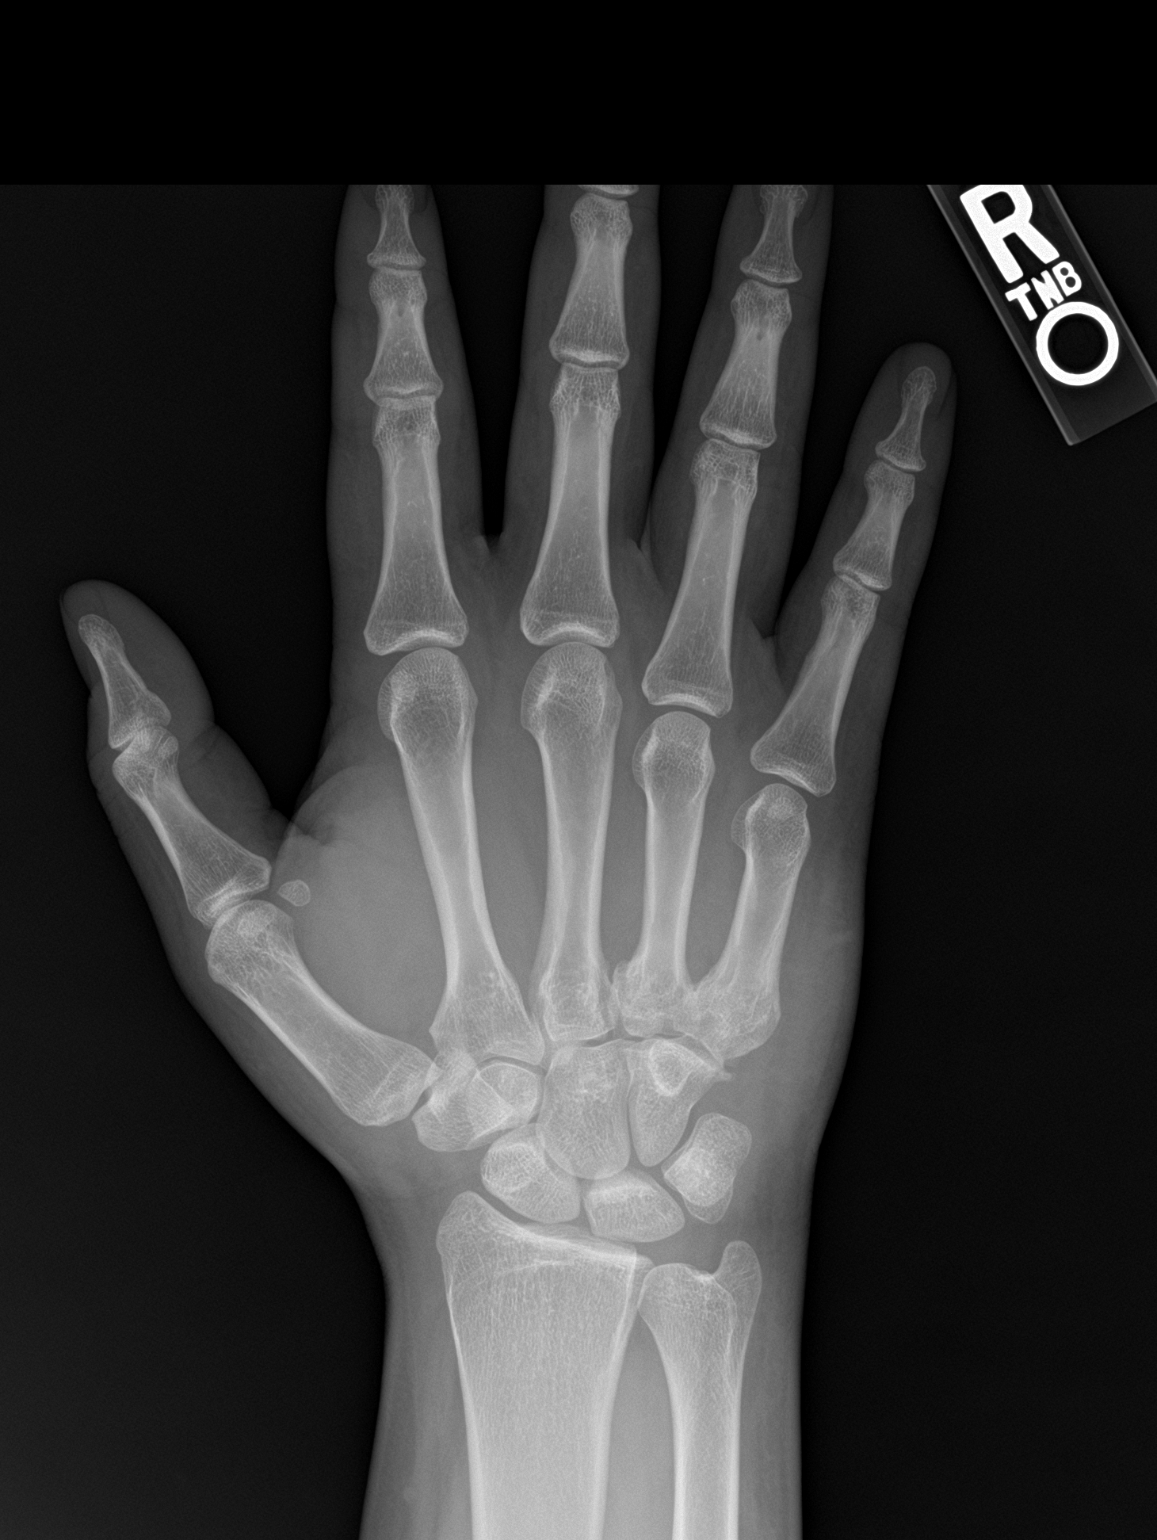

[hand obl]
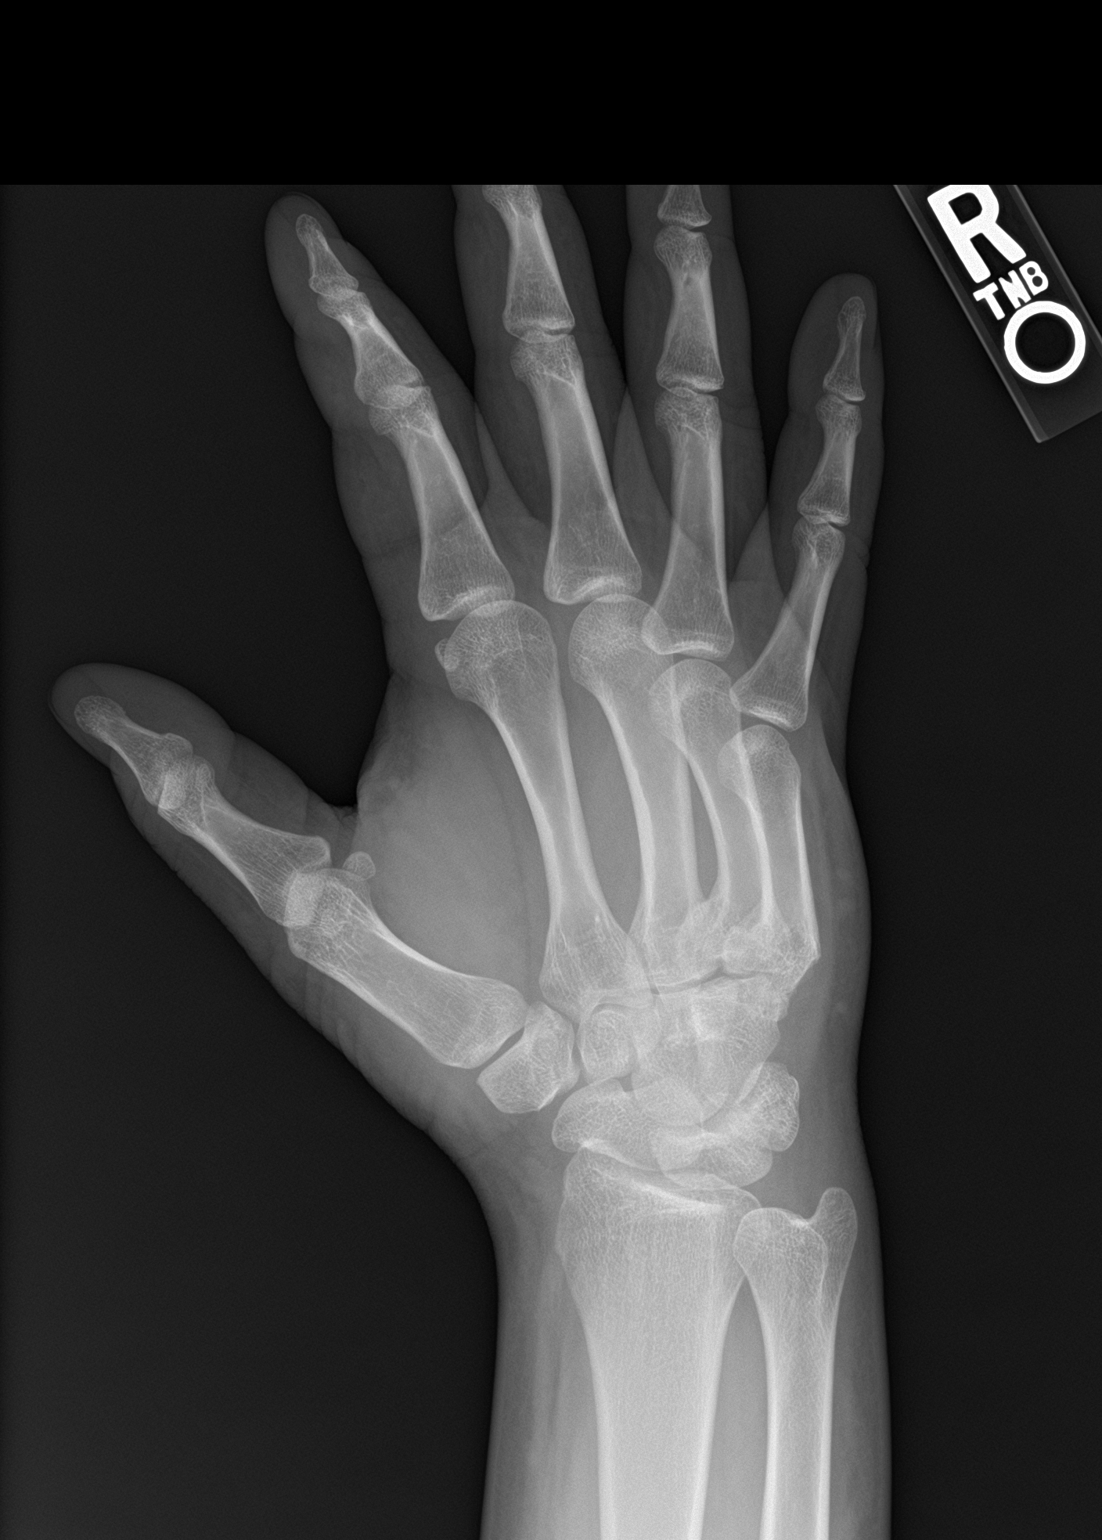

[hand lat]
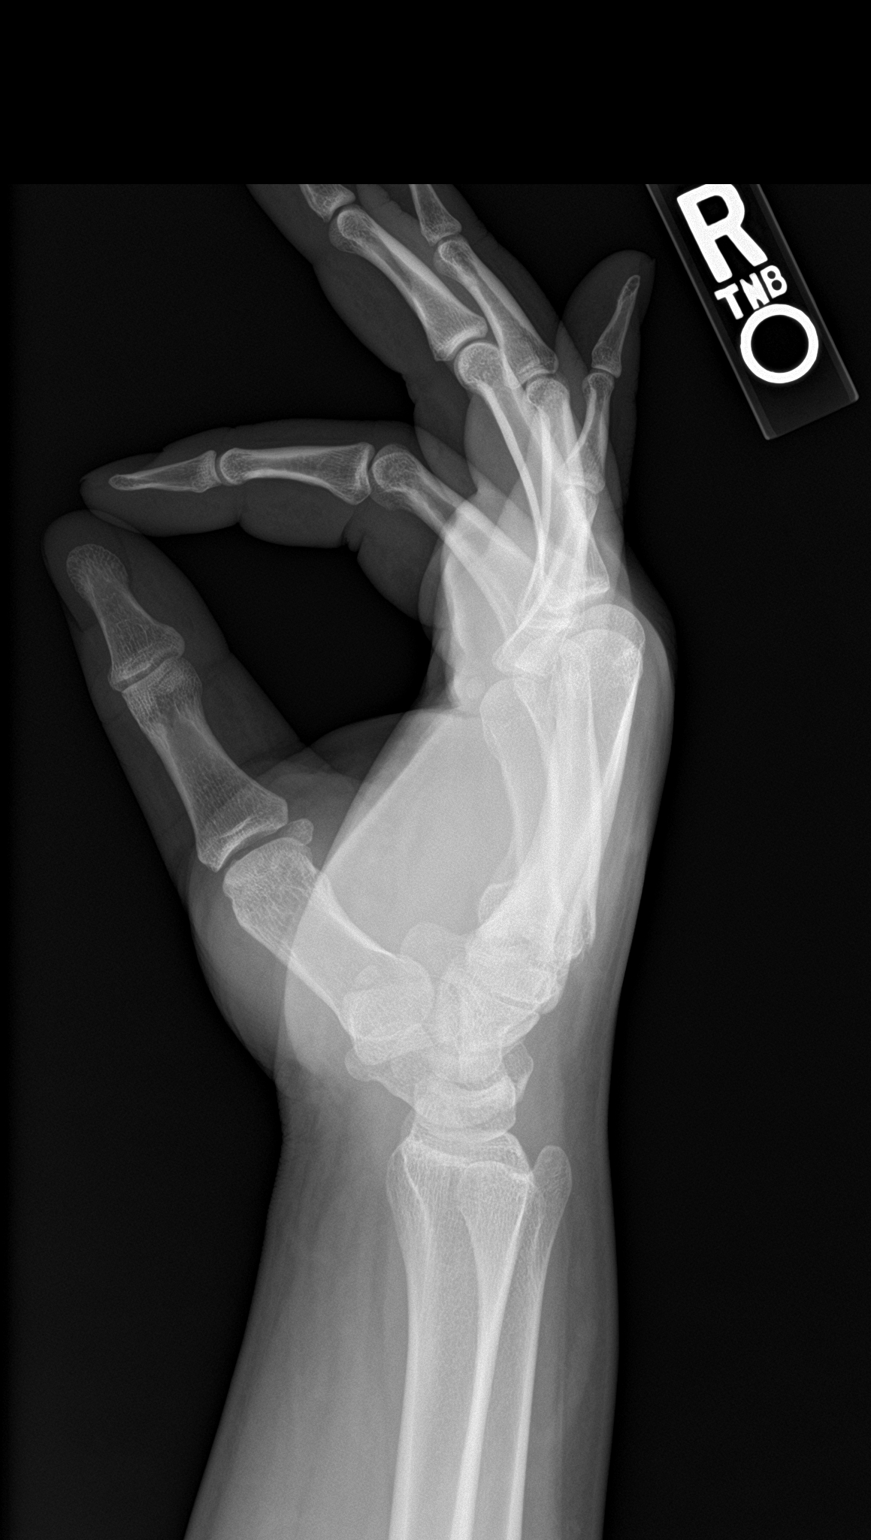

[3 of 3 positions shown; findings below may reference images not displayed]

FINDINGS: There are mildly angulated and impacted fractures of the bases of
the fourth and fifth metacarpals. No evidence of dislocation. There
is a small amount of bony irregularity on frontal view at the
lateral aspect of the hamate bone raising concern for a small
fracture. There is dorsal lateral soft tissue swelling.
IMPRESSION: 1. Mildly angulated and impacted fractures of the bases of the
fourth and fifth metacarpals.
2. Subtle bony irregularity at the lateral aspect of the hamate bone
may represent a small fracture.

## 2021-03-29 ENCOUNTER — Other Ambulatory Visit: Payer: Self-pay

## 2022-02-21 ENCOUNTER — Encounter (HOSPITAL_COMMUNITY): Payer: Self-pay | Admitting: Emergency Medicine

## 2022-02-21 ENCOUNTER — Emergency Department (HOSPITAL_COMMUNITY)
Admission: EM | Admit: 2022-02-21 | Discharge: 2022-02-21 | Disposition: A | Discharge status: Home / Self Care | Payer: BC Managed Care – PPO | Attending: Emergency Medicine | Admitting: Emergency Medicine

## 2022-02-21 ENCOUNTER — Emergency Department (HOSPITAL_COMMUNITY): Payer: BC Managed Care – PPO

## 2022-02-21 DIAGNOSIS — J45909 Unspecified asthma, uncomplicated: Secondary | ICD-10-CM | POA: Diagnosis not present

## 2022-02-21 DIAGNOSIS — F419 Anxiety disorder, unspecified: Secondary | ICD-10-CM | POA: Diagnosis present

## 2022-02-21 MED ORDER — HYDROXYZINE HCL 50 MG PO TABS: 50.0000 mg | ORAL_TABLET | Freq: Four times a day (QID) | ORAL | 0 refills | Status: DC | PRN

## 2022-02-21 MED ORDER — ALBUTEROL SULFATE HFA 108 (90 BASE) MCG/ACT IN AERS
2.0000 | INHALATION_SPRAY | Freq: Once | RESPIRATORY_TRACT | Status: AC
  Administered 2022-02-21: 2 via RESPIRATORY_TRACT
  Filled 2022-02-21: qty 6.7

## 2022-02-21 NOTE — ED Notes (Signed)
Pt verbalizes understanding of discharge instructions. Opportunity for questions and answers were provided. Pt discharged from the ED.   ?

## 2022-02-21 NOTE — ED Provider Notes (Signed)
?[facility] EMERGENCY DEPARTMENT ?Provider Note ? ? ?CSN: [id number] ?Arrival date & time: 02/21/22  1224 ? ?  ? ?History ? ?Chief Complaint  ?Patient presents with  ? Anxiety  ? ? ?Roger Kelly is a 26 y.o. male with a pertinent past history of anxiety, asthma, seasonal allergies.  Presents to the emergency department with complaint of increased anxiety and increased use of his albuterol inhaler. ? ?Patient reports that over the last 2 weeks he has had increased anxiety.  Patient states that he was treated in the past years for anxiety but does not remember being on any specific medications.  Patient denies any specific cause for his increased anxiety.  He does state that he started a new job and has had an increase stress.  Patient states that he finds himself getting caught in thoughts or goals.  Patient denies any SI, HI, or AVH. ? ?Patient reports that normally he uses his inhaler very infrequently usually when asthma is exercise-induced.  Patient states that over the last 2 weeks he has been using his inhaler more frequently.  States that he is currently out of his albuterol inhaler.  Patient denies any fever, chills, cough, shortness of breath, hemoptysis, chest pain, palpitations, leg swelling or tenderness, lightheadedness, syncope. ? ? ?Anxiety ?Pertinent negatives include no chest pain, no abdominal pain, no headaches and no shortness of breath.  ? ?  ? ?Home Medications ?Prior to Admission medications   ?Medication Sig Start Date End Date Taking? Authorizing Provider  ?ibuprofen (ADVIL) 800 MG tablet Take 1 tablet (800 mg total) by mouth 3 (three) times daily. 04/05/20   Dahlia Byes A, NP  ?albuterol (PROVENTIL HFA;VENTOLIN HFA) 108 (90 BASE) MCG/ACT inhaler Inhale 1-2 puffs into the lungs every 6 (six) hours as needed for wheezing or shortness of breath. 07/31/14 04/05/20  Loren Racer, MD  ?   ? ?Allergies    ?Penicillins and Amoxicillin   ? ?Review of Systems   ?Review of  Systems  ?Constitutional:  Negative for chills and fever.  ?Respiratory:  Positive for wheezing. Negative for cough and shortness of breath.   ?Cardiovascular:  Negative for chest pain, palpitations and leg swelling.  ?Gastrointestinal:  Negative for abdominal pain, nausea and vomiting.  ?Musculoskeletal:  Negative for back pain and neck pain.  ?Skin:  Negative for color change and rash.  ?Neurological:  Negative for dizziness, syncope, light-headedness and headaches.  ?Psychiatric/Behavioral:  Negative for confusion, hallucinations, self-injury and suicidal ideas. The patient is nervous/anxious.   ? ?Physical Exam ?Updated Vital Signs ?BP 116/62 (BP Location: Left Arm)   Pulse 66   Temp 98.5 ?F (36.9 ?C) (Oral)   Resp 17   SpO2 97%  ?Physical Exam ?Vitals and nursing note reviewed.  ?Constitutional:   ?   General: He is not in acute distress. ?   Appearance: He is not ill-appearing, toxic-appearing or diaphoretic.  ?HENT:  ?   Head: Normocephalic.  ?Eyes:  ?   General: No scleral icterus.    ?   Right eye: No discharge.     ?   Left eye: No discharge.  ?Cardiovascular:  ?   Rate and Rhythm: Normal rate.  ?   Pulses:     ?     Radial pulses are 2+ on the right side and 2+ on the left side.  ?Pulmonary:  ?   Effort: Pulmonary effort is normal. No tachypnea, bradypnea or respiratory distress.  ?   Breath sounds: Normal breath  sounds. No stridor.  ?   Comments: Speaks in full complete sentences without difficulty. ?Skin: ?   General: Skin is warm and dry.  ?Neurological:  ?   General: No focal deficit present.  ?   Mental Status: He is alert and oriented to person, place, and time.  ?   GCS: GCS eye subscore is 4. GCS verbal subscore is 5. GCS motor subscore is 6.  ?Psychiatric:     ?   Attention and Perception: He is attentive. He does not perceive auditory or visual hallucinations.     ?   Mood and Affect: Mood normal.     ?   Behavior: Behavior is cooperative.     ?   Thought Content: Thought content is not  paranoid or delusional. Thought content does not include homicidal or suicidal ideation. Thought content does not include homicidal or suicidal plan.  ? ? ?ED Results / Procedures / Treatments   ?Labs ?(all labs ordered are listed, but only abnormal results are displayed) ?Labs Reviewed - No data to display ? ?EKG ?None ? ?Radiology ?DG Chest 2 View ? ?Result Date: 02/21/2022 ?CLINICAL DATA:  Shortness of breath.  Asthma. EXAM: CHEST - 2 VIEW COMPARISON:  Chest two views 12/07/2020 FINDINGS: Cardiac silhouette and mediastinal contours are within normal limits. The lungs are clear. No pleural effusion or pneumothorax. No acute skeletal abnormality. IMPRESSION: No active cardiopulmonary disease. Electronically Signed   By: Neita Garnetonald  Viola M.D.   On: 02/21/2022 14:39   ? ?Procedures ?Procedures  ? ? ?Medications Ordered in ED ?Medications  ?albuterol (VENTOLIN HFA) 108 (90 Base) MCG/ACT inhaler 2 puff (2 puffs Inhalation Given 02/21/22 1329)  ? ? ?ED Course/ Medical Decision Making/ A&P ?  ?                        ?Medical Decision Making ? ?Alert 26 year old male in no acute stress, nontoxic-appearing.  Presents emergency department with chief complaint of increased anxiety and wheezing. ? ?Information obtained from patient.  Past medical records were reviewed including previous provider notes, labs, and imaging.  Patient has medical history as outlined in HPI which complicates care. ? ?Patient was noted to have minimal wheezing on exam in triage.  Patient was given albuterol inhaler.  On my exam lungs are clear to auscultation bilaterally.  Patient is speaking in full complete sentences without difficulty. ? ?We will obtain EKG and chest x-ray for further evaluation.   ? ?I personally viewed and interpreted patient's EKG.  Tracing shows normal sinus rhythm with sinus arrhythmia. ? ?I personally viewed and interpreted patient's chest x-ray.  Imaging shows no active cardiopulmonary disease. ? ?Patient reports increased  anxiety.  He denies any SI, HI, AVH.  Patient does not appear acutely psychotic or manic at this time.  Will prescribe patient with short course of Atarax.  Patient given information to follow-up with psychiatry/counseling in the outpatient setting. ? ?Patient will be sent home with the albuterol inhaler he used earlier today.  Patient advised to follow-up with primary care provider for further management of his asthma. ? ?Based on patient's chief complaint, I considered admission might be necessary, however after reassuring ED workup feel patient is reasonable for discharge.  Discussed results, findings, treatment and follow up. Patient advised of return precautions. Patient verbalized understanding and agreed with plan. ? ?Portions of this note were generated with Scientist, clinical (histocompatibility and immunogenetics)Dragon dictation software. Dictation errors may occur despite best attempts at proofreading. ? ? ? ? ? ? ? ? ?  Final Clinical Impression(s) / ED Diagnoses ?Final diagnoses:  ?None  ? ? ?Rx / DC Orders ?ED Discharge Orders   ? ? None  ? ?  ? ? ?  ?Haskel Schroeder, PA-C ?02/21/22 1443 ? ?  ?Pricilla Loveless, MD ?02/22/22 [phone] ? ?

## 2022-02-21 NOTE — ED Triage Notes (Signed)
Patient complains of an increase in his baseline anxiety that started two weeks ago, also complains of having difficulty with his asthma over the same time period and having run out of his albuterol inhaler, also complains of an intermittent headache. Patient is alert, oriented, and in no apparent distress at this time. ?

## 2022-02-21 NOTE — Discharge Instructions (Addendum)
He came to the emergency department today to be evaluated for your increased anxiety and wheezing.  Your chest x-ray showed no signs of pneumonia or other abnormalities with your lungs.  You were given a albuterol inhaler.  Please follow-up with your primary care provider for further management of your asthma. ? ?Due to your anxiety have given you the prescription for a medication called Atarax.  You may take this every 6 hours as needed for anxiety.  Please follow-up with a psychiatrist/counselor listed on the paperwork given to you for further management of your anxiety. ? ?Get help right away if: ?You are getting worse and do not respond to treatment during an asthma attack. ?You are short of breath when at rest or when doing very little physical activity. ?You have difficulty eating, drinking, or talking. ?You have chest pain or tightness. ?You develop a fast heartbeat or palpitations. ?You have a bluish color to your lips or fingernails. ?You are light-headed or dizzy, or you faint. ?You feel too tired to breathe normally. ?You develop thoughts about wanting to hurt yourself, or other people, see things that are not there, or hear things that are not there ?

## 2022-02-21 NOTE — ED Provider Triage Note (Signed)
Emergency Medicine Provider Triage Evaluation Note ? ?Roger Kelly , a 26 y.o. male  was evaluated in triage.  Pt complains of increased anxiety for two weeks.  ? ?He reports that he used to be treated for anxiety. He reports getting caught in thought circles.  No SI, HI or AVH.  ? ?He reports that he is out of his asthma medication.   ? ? ? ? ? ?Physical Exam  ?BP (!) 141/89 (BP Location: Left Arm)   Pulse 74   Temp 99.1 ?F (37.3 ?C) (Oral)   Resp 16   SpO2 98%  ?Gen:   Awake, no distress   ?Resp:  Normal effort, minimal wheezing.  ?MSK:   Moves extremities without difficulty  ?Other:  Normal speech, no SI, HI, or AVH.  ? ?Medical Decision Making  ?Medically screening exam initiated at 1:17 PM.  Appropriate orders placed.  Roger Kelly was informed that the remainder of the evaluation will be completed by another provider, this initial triage assessment does not replace that evaluation, and the importance of remaining in the ED until their evaluation is complete. ? ?As patient reports generalized anxiety without SI, HI, or AVH I do not think full medical clearance labs are indicated at this time. ?We will check an EKG given his shortness of breath with intermittent chest pain although he attributes that to anxiety, and a chest x-ray.  Albuterol inhaler is ordered. ?  ?Roger Gong, PA-C ?02/21/22 1324 ? ?

## 2022-12-20 IMAGING — CR DG CHEST 2V
2 series · 2 of 2 positions shown · non-contrast
Comparison: Chest two views 12/07/2020

CLINICAL DATA: Shortness of breath.  Asthma.

EXAM:
CHEST - 2 VIEW

[chest pa]
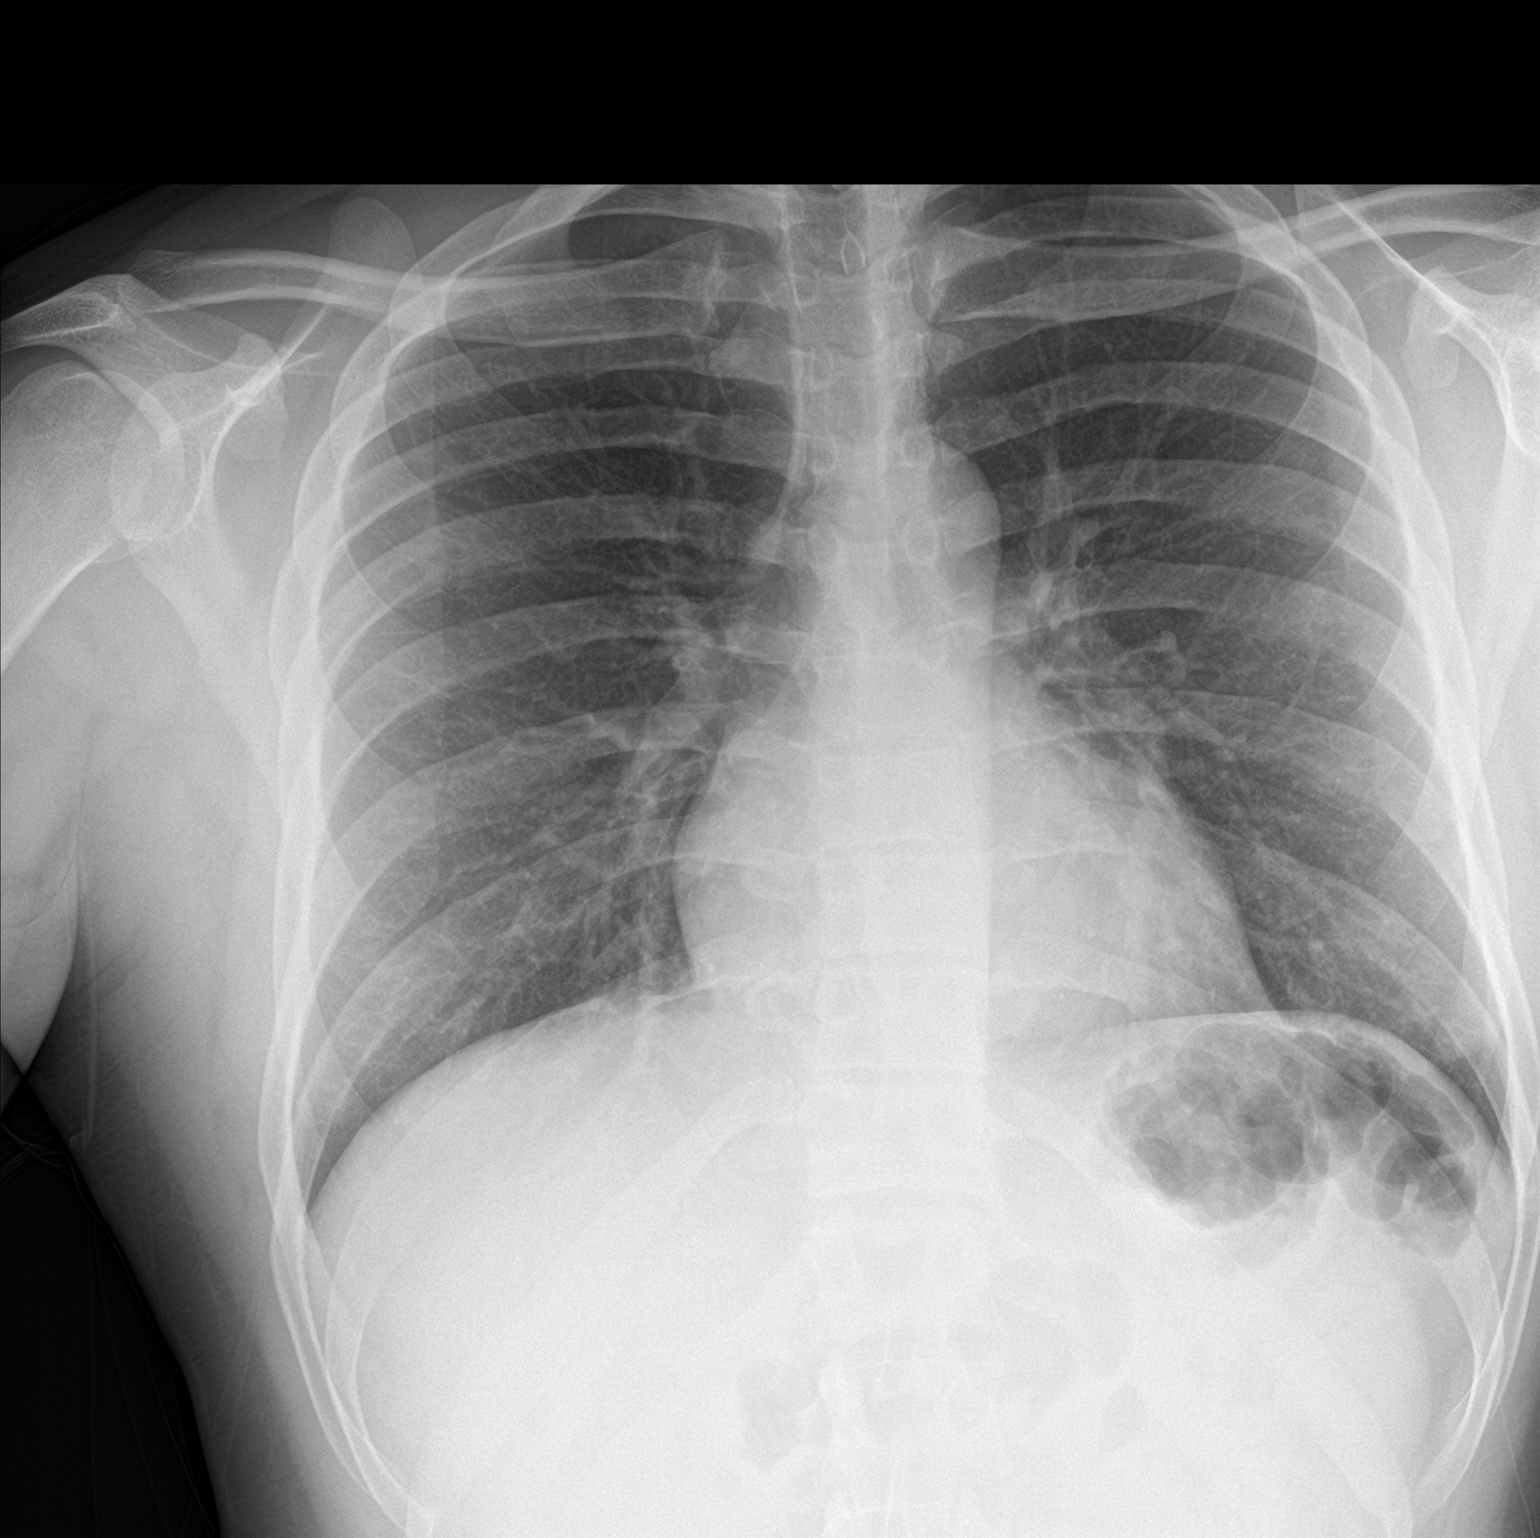

[chest lat]
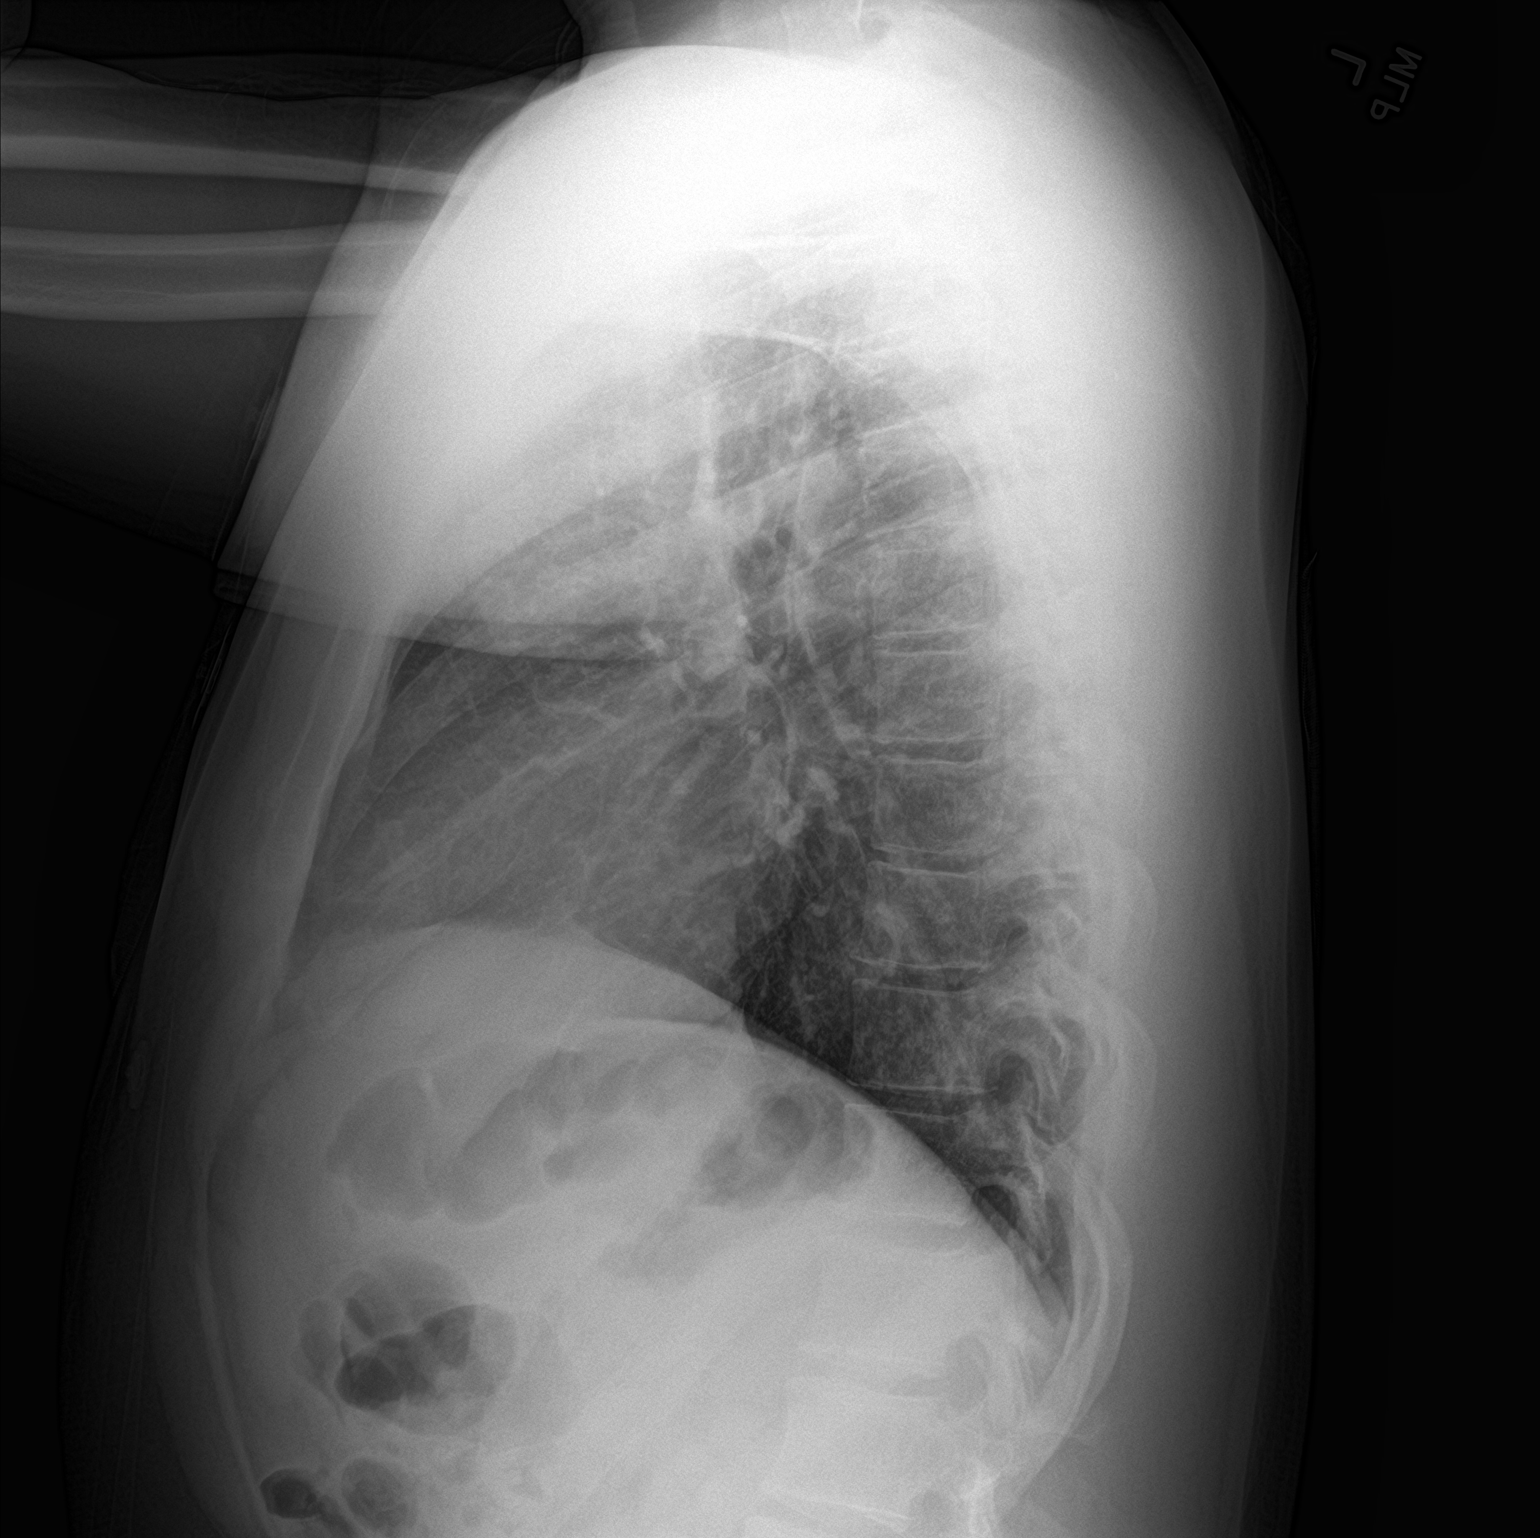

[2 of 2 positions shown; findings below may reference images not displayed]

FINDINGS: Cardiac silhouette and mediastinal contours are within normal
limits. The lungs are clear. No pleural effusion or pneumothorax. No
acute skeletal abnormality.
IMPRESSION: No active cardiopulmonary disease.

## 2023-02-01 ENCOUNTER — Telehealth: Payer: Self-pay

## 2023-02-01 NOTE — Telephone Encounter (Signed)
Mychart msg sent

## 2023-02-13 ENCOUNTER — Emergency Department (HOSPITAL_COMMUNITY): Payer: Medicaid Other

## 2023-02-13 ENCOUNTER — Encounter (HOSPITAL_COMMUNITY): Payer: Self-pay

## 2023-02-13 ENCOUNTER — Other Ambulatory Visit: Payer: Self-pay

## 2023-02-13 ENCOUNTER — Emergency Department (HOSPITAL_COMMUNITY)
Admission: EM | Admit: 2023-02-13 | Discharge: 2023-02-14 | Disposition: A | Discharge status: Home / Self Care | Payer: Medicaid Other | Attending: Emergency Medicine | Admitting: Emergency Medicine

## 2023-02-13 DIAGNOSIS — X509XXA Other and unspecified overexertion or strenuous movements or postures, initial encounter: Secondary | ICD-10-CM | POA: Insufficient documentation

## 2023-02-13 DIAGNOSIS — M7041 Prepatellar bursitis, right knee: Secondary | ICD-10-CM | POA: Diagnosis not present

## 2023-02-13 DIAGNOSIS — M7051 Other bursitis of knee, right knee: Secondary | ICD-10-CM

## 2023-02-13 DIAGNOSIS — M7989 Other specified soft tissue disorders: Secondary | ICD-10-CM | POA: Diagnosis not present

## 2023-02-13 DIAGNOSIS — M25561 Pain in right knee: Secondary | ICD-10-CM | POA: Diagnosis not present

## 2023-02-13 DIAGNOSIS — J45909 Unspecified asthma, uncomplicated: Secondary | ICD-10-CM | POA: Insufficient documentation

## 2023-02-13 DIAGNOSIS — Y9367 Activity, basketball: Secondary | ICD-10-CM | POA: Insufficient documentation

## 2023-02-13 DIAGNOSIS — S8991XA Unspecified injury of right lower leg, initial encounter: Secondary | ICD-10-CM | POA: Diagnosis not present

## 2023-02-13 MED ORDER — IBUPROFEN 800 MG PO TABS
800.0000 mg | ORAL_TABLET | Freq: Once | ORAL | Status: AC
  Administered 2023-02-13: 800 mg via ORAL
  Filled 2023-02-13: qty 1

## 2023-02-13 NOTE — ED Triage Notes (Signed)
Playing basketball this afternoon. Hyperextended right knee. Took Tylenol with no relief.

## 2023-02-14 NOTE — Discharge Instructions (Signed)
Please wear the knee immobilizer until you are evaluated by orthopedics.  You can take 800 mg of ibuprofen every 8 hours as needed for pain.  Please make sure to follow-up with orthopedics as you likely will need a MRI of your knee.  I provided their contact information in your discharge paperwork.

## 2023-02-14 NOTE — ED Provider Notes (Signed)
[facility] Provider Note   CSN: [id number] Arrival date & time: 02/13/23  2215     History  Chief Complaint  Patient presents with   Knee Pain    Roger Kelly is a 27 y.o. male.  HPI 26 year old male with a history of asthma, anxiety and seasonal allergies presents to the ER with complaints of right knee pain.  Patient states that he was playing basketball and thinks he hyperextended his right knee.  Pain has been increasing over the last several hours.  He took ibuprofen here in the emergency department.  Has still been able to ambulate though with pain.  Home Medications Prior to Admission medications   Medication Sig Start Date End Date Taking? Authorizing Provider  hydrOXYzine (ATARAX) 50 MG tablet Take 1 tablet (50 mg total) by mouth every 6 (six) hours as needed for anxiety. 02/21/22   Loni Beckwith, PA-C  ibuprofen (ADVIL) 800 MG tablet Take 1 tablet (800 mg total) by mouth 3 (three) times daily. 04/05/20   Loura Halt A, NP  albuterol (PROVENTIL HFA;VENTOLIN HFA) 108 (90 BASE) MCG/ACT inhaler Inhale 1-2 puffs into the lungs every 6 (six) hours as needed for wheezing or shortness of breath. 07/31/14 04/05/20  Julianne Rice, MD      Allergies    Penicillins and Amoxicillin    Review of Systems   Review of Systems Ten systems reviewed and are negative for acute change, except as noted in the HPI.   Physical Exam Updated Vital Signs BP 132/84 (BP Location: Right Arm)   Pulse 87   Temp 98.8 F (37.1 C)   Resp 12   Ht 6\' 3"  (1.905 m)   Wt 108.9 kg   SpO2 96%   BMI 30.00 kg/m  Physical Exam Vitals and nursing note reviewed.  Constitutional:      General: He is not in acute distress.    Appearance: He is well-developed.  HENT:     Head: Normocephalic and atraumatic.  Eyes:     Conjunctiva/sclera: Conjunctivae normal.  Cardiovascular:     Rate and Rhythm: Normal rate and regular rhythm.     Heart sounds:  No murmur heard. Pulmonary:     Effort: Pulmonary effort is normal. No respiratory distress.     Breath sounds: Normal breath sounds.  Abdominal:     Palpations: Abdomen is soft.     Tenderness: There is no abdominal tenderness.  Musculoskeletal:        General: Swelling and tenderness present.     Cervical back: Neck supple.     Right lower leg: No edema.     Left lower leg: No edema.     Comments: Right knee with mild to moderate infusion, no overlying warmth, erythema.  Full flexion and extension of the knee though with some pain.  DP pulses intact.  Calf and quad compartments are soft.  No tibial plateau tenderness.  Skin:    General: Skin is warm and dry.     Capillary Refill: Capillary refill takes less than 2 seconds.  Neurological:     Mental Status: He is alert.  Psychiatric:        Mood and Affect: Mood normal.     ED Results / Procedures / Treatments   Labs (all labs ordered are listed, but only abnormal results are displayed) Labs Reviewed - No data to display  EKG None  Radiology DG Knee 1-2 Views Right  Result Date: 02/13/2023 CLINICAL  DATA:  12878.  Knee swelling.  Hyperextension injury. EXAM: RIGHT KNEE - 1-2 VIEW COMPARISON:  Study of 09/25/2016 FINDINGS: On the lateral view there is a small suprapatellar bursal effusion is new from 2017. On the AP view there is new demonstration of a 1.5 cm in length linear calcification along side the junction of the medial aspect of the distal femoral metaphysis and the medial femoral condyle. This could be a dystrophic calcification such as from a prior proximal MCL avulsion or could indicate a recent injury with avulsion. Correlate clinically for point tenderness. Elsewhere there is no evidence of fracture, dislocation or degenerative changes. The bone mineralization is normal. IMPRESSION: 1. New demonstration of a small suprapatellar bursal effusion. 2. New demonstration of a 1.5 cm in length linear calcification along the  junction of the medial aspect of the distal femoral metaphysis and 3 medial femoral condyle. This could be a dystrophic calcification such as from a prior proximal MCL avulsion or could indicate a recent avulsion. 3. Otherwise negative AP Lat exam. Electronically Signed   By: Almira Bar M.D.   On: 02/13/2023 22:59    Procedures Procedures    Medications Ordered in ED Medications  ibuprofen (ADVIL) tablet 800 mg (800 mg Oral Given 02/13/23 2235)    ED Course/ Medical Decision Making/ A&P                             Medical Decision Making Risk Prescription drug management.   27 year old male who presents to the ER with right knee pain.  Right knee has a mild to moderate effusion, though he is able to range his knee fully.  He has some generalized tenderness, no tibial plateau tenderness.  X-ray ordered in triage, reviewed, agree with radiology read, findings as below  IMPRESSION:  1. New demonstration of a small suprapatellar bursal effusion.  2. New demonstration of a 1.5 cm in length linear calcification  along the junction of the medial aspect of the distal femoral  metaphysis and 3 medial femoral condyle. This could be a dystrophic  calcification such as from a prior proximal MCL avulsion or could  indicate a recent avulsion.  3. Otherwise negative AP Lat exam.    Patient received ibuprofen while here in the emergency department.  Will place in a knee immobilizer, and ultimately he will need a MRI to further delineate chronicity of the a forementioned linear calcification.  Will provide referral to orthopedics.  Encouraged ibuprofen, ice for pain.  We discussed return precautions.  He voiced understanding and is agreeable.  Stable for discharge. Final Clinical Impression(s) / ED Diagnoses Final diagnoses:  Suprapatellar bursitis of right knee  Acute pain of right knee    Rx / DC Orders ED Discharge Orders     None         Leone Brand 02/14/23 0044     Nira Conn, MD 02/17/23 1757

## 2023-02-14 NOTE — Progress Notes (Signed)
Orthopedic Tech Progress Note Patient Details:  Roger Kelly 12-May-1996 [id number]  Ortho Devices Type of Ortho Device: Knee Immobilizer, Crutches Ortho Device/Splint Location: RLE Ortho Device/Splint Interventions: Ordered, Application, Adjustment   Post Interventions Patient Tolerated: Well Instructions Provided: Adjustment of device, Care of device, Poper ambulation with device  [name] [name] 02/14/2023, 12:51 AM

## 2023-02-22 DIAGNOSIS — M25561 Pain in right knee: Secondary | ICD-10-CM | POA: Diagnosis not present

## 2023-02-22 DIAGNOSIS — M25461 Effusion, right knee: Secondary | ICD-10-CM | POA: Diagnosis not present

## 2023-11-28 ENCOUNTER — Other Ambulatory Visit: Payer: Self-pay

## 2023-11-28 ENCOUNTER — Emergency Department (HOSPITAL_COMMUNITY)
Admission: EM | Admit: 2023-11-28 | Discharge: 2023-11-28 | Discharge status: Against Medical Advice | Payer: Medicaid Other | Attending: Emergency Medicine | Admitting: Emergency Medicine

## 2023-11-28 DIAGNOSIS — R0602 Shortness of breath: Secondary | ICD-10-CM | POA: Diagnosis not present

## 2023-11-28 DIAGNOSIS — R42 Dizziness and giddiness: Secondary | ICD-10-CM | POA: Insufficient documentation

## 2023-11-28 DIAGNOSIS — K920 Hematemesis: Secondary | ICD-10-CM | POA: Diagnosis not present

## 2023-11-28 DIAGNOSIS — Z5321 Procedure and treatment not carried out due to patient leaving prior to being seen by health care provider: Secondary | ICD-10-CM | POA: Diagnosis not present

## 2023-11-28 DIAGNOSIS — R109 Unspecified abdominal pain: Secondary | ICD-10-CM | POA: Diagnosis not present

## 2023-11-28 DIAGNOSIS — R1110 Vomiting, unspecified: Secondary | ICD-10-CM | POA: Diagnosis not present

## 2023-11-28 LAB — COMPREHENSIVE METABOLIC PANEL
ALT: 31 U/L (ref 0–44)
AST: 17 U/L (ref 15–41)
Albumin: 4.4 g/dL (ref 3.5–5.0)
Alkaline Phosphatase: 88 U/L (ref 38–126)
Anion gap: 9 (ref 5–15)
BUN: 7 mg/dL (ref 6–20)
CO2: 23 mmol/L (ref 22–32)
Calcium: 9.5 mg/dL (ref 8.9–10.3)
Chloride: 108 mmol/L (ref 98–111)
Creatinine, Ser: 0.95 mg/dL (ref 0.61–1.24)
GFR, Estimated: >60 mL/min (ref >60)
Glucose, Bld: 100 mg/dL — ABNORMAL HIGH (ref 70–99)
Potassium: 3.8 mmol/L (ref 3.5–5.1)
Sodium: 140 mmol/L (ref 135–145)
Total Bilirubin: 0.9 mg/dL (ref 0.0–1.2)
Total Protein: 7.6 g/dL (ref 6.5–8.1)

## 2023-11-28 LAB — CBC WITH DIFFERENTIAL/PLATELET
Abs Immature Granulocytes: 0.01 10*3/uL (ref 0.00–0.07)
Basophils Absolute: 0 10*3/uL (ref 0.0–0.1)
Basophils Relative: 0 %
Eosinophils Absolute: 0.1 10*3/uL (ref 0.0–0.5)
Eosinophils Relative: 2 %
HCT: 45.8 % (ref 39.0–52.0)
Hemoglobin: 16.1 g/dL (ref 13.0–17.0)
Immature Granulocytes: 0 %
Lymphocytes Relative: 33 %
Lymphs Abs: 1.8 10*3/uL (ref 0.7–4.0)
MCH: 30.9 pg (ref 26.0–34.0)
MCHC: 35.2 g/dL (ref 30.0–36.0)
MCV: 87.9 fL (ref 80.0–100.0)
Monocytes Absolute: 0.5 10*3/uL (ref 0.1–1.0)
Monocytes Relative: 9 %
Neutro Abs: 3 10*3/uL (ref 1.7–7.7)
Neutrophils Relative %: 56 %
Platelets: 339 10*3/uL (ref 150–400)
RBC: 5.21 MIL/uL (ref 4.22–5.81)
RDW: 13.3 % (ref 11.5–15.5)
WBC: 5.4 10*3/uL (ref 4.0–10.5)
nRBC: 0 % (ref 0.0–0.2)

## 2023-11-28 LAB — LIPASE, BLOOD: Lipase: 27 U/L (ref 11–51)

## 2023-11-28 NOTE — ED Triage Notes (Signed)
 Starting a week, throwing up blood, stomache, and cough.  Dizziness and sob as of yesterday.    Pt reports asthma.

## 2023-11-28 NOTE — ED Provider Triage Note (Signed)
Emergency Medicine Provider Triage Evaluation Note  Roger Kelly , a 28 y.o. male  was evaluated in triage.  Pt complains of abdominal pain vomiting and streaky red blood in his vomitus.  Had epigastric abdominal pain for 1 week.  Started vomiting yesterday.  No known history of reflux.  He does feel a bit dizzy today.  No clots, no melena or hematochezia..  Review of Systems  Positive: Epigastric pain Negative: Shortness of breath or chest pain  Physical Exam  There were no vitals taken for this visit. Gen:   Awake, no distress   Resp:  Normal effort  MSK:   Moves extremities without difficulty  Other:  No abdominal tenderness on exam  Medical Decision Making  Medically screening exam initiated at 1:49 PM.  Appropriate orders placed.  Ceejay Marrin was informed that the remainder of the evaluation will be completed by another provider, this initial triage assessment does not replace that evaluation, and the importance of remaining in the ED until their evaluation is complete.     Arthor Captain, PA-C 11/28/23 1350

## 2023-11-28 NOTE — ED Notes (Signed)
 Pt left without being seen.

## 2023-11-30 ENCOUNTER — Ambulatory Visit (HOSPITAL_COMMUNITY): Payer: Medicaid Other

## 2024-04-22 ENCOUNTER — Encounter (HOSPITAL_COMMUNITY): Payer: Self-pay

## 2024-04-22 ENCOUNTER — Ambulatory Visit (HOSPITAL_COMMUNITY)
Admission: EM | Admit: 2024-04-22 | Discharge: 2024-04-22 | Disposition: A | Discharge status: Home / Self Care | Attending: Emergency Medicine | Admitting: Emergency Medicine

## 2024-04-22 ENCOUNTER — Ambulatory Visit (INDEPENDENT_AMBULATORY_CARE_PROVIDER_SITE_OTHER)

## 2024-04-22 DIAGNOSIS — S8992XA Unspecified injury of left lower leg, initial encounter: Secondary | ICD-10-CM

## 2024-04-22 DIAGNOSIS — R29898 Other symptoms and signs involving the musculoskeletal system: Secondary | ICD-10-CM | POA: Diagnosis not present

## 2024-04-22 MED ORDER — IBUPROFEN 800 MG PO TABS
800.0000 mg | ORAL_TABLET | Freq: Once | ORAL | Status: AC
  Administered 2024-04-22: 800 mg via ORAL

## 2024-04-22 MED ORDER — IBUPROFEN 800 MG PO TABS: 800.0000 mg | ORAL_TABLET | Freq: Three times a day (TID) | ORAL | 0 refills | Status: DC

## 2024-04-22 MED ORDER — IBUPROFEN 800 MG PO TABS
ORAL_TABLET | ORAL | Status: AC
  Filled 2024-04-22: qty 1

## 2024-04-22 NOTE — ED Triage Notes (Signed)
 Patient reports that he was playing basketball yesterday and felt a pop in his left knee.  Patient states he put a knee brace o his left knee that he had from a previous right knee injury.

## 2024-04-22 NOTE — Discharge Instructions (Addendum)
 Rest - try to avoid heavy lifting and high impact activity Ice - apply for 20 minutes a few times daily Compression - use knee brace for standing/walking  Elevation - prop up on a pillow Ibuprofen  - 1 tablet every 6 hours for pain and inflammation  Please follow with orthopedics as soon as able

## 2024-04-22 NOTE — ED Provider Notes (Signed)
 [facility]    CSN: [id number] Arrival date & time: 04/22/24  1758      History   Chief Complaint Chief Complaint  Patient presents with   Knee Injury    HPI Roger Kelly is a 28 y.o. male.  LEFT knee injury occurred yesterday Was playing basketball, came down from a jump and felt a pop. Felt like the patella dislocated.  Now having 9/10 pain with weightbearing.  He had an old knee brace that he put on but it didn't fit.  No other interventions yet  Had RIGHT knee pain last year, saw [facility]  Past Medical History:  Diagnosis Date   Anxiety    Asthma    Seasonal allergies     There are no active problems to display for this patient.   Past Surgical History:  Procedure Laterality Date   NO PAST SURGERIES       Home Medications    Prior to Admission medications   Medication Sig Start Date End Date Taking? Authorizing Provider  ibuprofen  (ADVIL ) 800 MG tablet Take 1 tablet (800 mg total) by mouth 3 (three) times daily. 04/22/24  Yes Brandin Dilday, Ivette Marks, PA-C  hydrOXYzine  (ATARAX ) 50 MG tablet Take 1 tablet (50 mg total) by mouth every 6 (six) hours as needed for anxiety. 02/21/22   Marshal Skeens, PA-C  albuterol  (PROVENTIL  HFA;VENTOLIN  HFA) 108 (90 BASE) MCG/ACT inhaler Inhale 1-2 puffs into the lungs every 6 (six) hours as needed for wheezing or shortness of breath. 07/31/14 04/05/20  Evone Hoh, MD    Family History Family History  Family history unknown: Yes    Social History Social History   Tobacco Use   Smoking status: Never   Smokeless tobacco: Never  Vaping Use   Vaping status: Never Used  Substance Use Topics   Alcohol use: No   Drug use: Yes    Types: Marijuana    Comment: occas     Allergies   Penicillins and Amoxicillin   Review of Systems Review of Systems  As per HPI  Physical Exam Triage Vital Signs ED Triage Vitals [04/22/24 1847]  Encounter Vitals Group     BP 137/83     Systolic BP Percentile       Diastolic BP Percentile      Pulse Rate 91     Resp 16     Temp 98.7 F (37.1 C)     Temp Source Oral     SpO2 94 %     Weight      Height      Head Circumference      Peak Flow      Pain Score 9     Pain Loc      Pain Education      Exclude from Growth Chart    No data found.  Updated Vital Signs BP 137/83 (BP Location: Right Arm)   Pulse 91   Temp 98.7 F (37.1 C) (Oral)   Resp 16   SpO2 94%    Physical Exam Vitals and nursing note reviewed.  Constitutional:      General: He is not in acute distress. HENT:     Mouth/Throat:     Pharynx: Oropharynx is clear.  Cardiovascular:     Rate and Rhythm: Normal rate and regular rhythm.     Pulses: Normal pulses.     Heart sounds: Normal heart sounds.  Pulmonary:     Effort: Pulmonary effort is normal.  Breath sounds: Normal breath sounds.  Musculoskeletal:     Left knee: Swelling present. Decreased range of motion. Tenderness present.     Comments: Mild swelling of left knee, reduced ROM due to pain. Tender with palpation of patella and infrapatellar. Distal sensation intact. Strong DP pulse. Cap refill < 2 seconds  Skin:    General: Skin is warm and dry.     Capillary Refill: Capillary refill takes less than 2 seconds.  Neurological:     Mental Status: He is alert and oriented to person, place, and time.     UC Treatments / Results  Labs (all labs ordered are listed, but only abnormal results are displayed) Labs Reviewed - No data to display  EKG  Radiology DG Knee Complete 4 Views Left Result Date: 04/22/2024 CLINICAL DATA:  Popping sensation in the knee while playing basketball EXAM: LEFT KNEE - COMPLETE 4 VIEW COMPARISON:  None Available. FINDINGS: No evidence of fracture, dislocation, or joint effusion. No evidence of arthropathy or other focal bone abnormality. Diffuse thickening of the patellar tendon. IMPRESSION: 1. Diffuse thickening of the patellar tendon, which can be seen in the setting of  patellar tendinopathy. 2. No acute fracture or dislocation. Electronically Signed   By: Limin  Xu M.D.   On: 04/22/2024 19:27    Procedures Procedures (including critical care time)  Medications Ordered in UC Medications  ibuprofen  (ADVIL ) tablet 800 mg (has no administration in time range)    Initial Impression / Assessment and Plan / UC Course  I have reviewed the triage vital signs and the nursing notes.  Pertinent labs & imaging results that were available during my care of the patient were reviewed by me and considered in my medical decision making (see chart for details).  Left knee xray without acute abnormality. Images independently reviewed by me, agree with radiology interpretation. Discussed possible soft tissue injury Knee brace provided in clinic Ibuprofen  dose given RICE therapy, pain control, and close follow with orthopedics is recommended. Patient agrees to plan  Final Clinical Impressions(s) / UC Diagnoses   Final diagnoses:  Injury of left knee, initial encounter     Discharge Instructions      Rest - try to avoid heavy lifting and high impact activity Ice - apply for 20 minutes a few times daily Compression - use knee brace for standing/walking  Elevation - prop up on a pillow Ibuprofen  - 1 tablet every 6 hours for pain and inflammation  Please follow with orthopedics as soon as able   ED Prescriptions     Medication Sig Dispense Auth. Provider   ibuprofen  (ADVIL ) 800 MG tablet Take 1 tablet (800 mg total) by mouth 3 (three) times daily. 21 tablet Evalin Shawhan, Ivette Marks, PA-C      PDMP not reviewed this encounter.   Newton Barer 04/22/24 1939

## 2024-09-02 ENCOUNTER — Other Ambulatory Visit: Payer: Self-pay

## 2024-09-02 ENCOUNTER — Ambulatory Visit (HOSPITAL_COMMUNITY)
Admission: EM | Admit: 2024-09-02 | Discharge: 2024-09-02 | Disposition: A | Discharge status: Home / Self Care | Attending: Internal Medicine | Admitting: Internal Medicine

## 2024-09-02 ENCOUNTER — Ambulatory Visit (HOSPITAL_COMMUNITY): Payer: Self-pay | Admitting: Internal Medicine

## 2024-09-02 DIAGNOSIS — G43009 Migraine without aura, not intractable, without status migrainosus: Secondary | ICD-10-CM | POA: Diagnosis not present

## 2024-09-02 DIAGNOSIS — Z113 Encounter for screening for infections with a predominantly sexual mode of transmission: Secondary | ICD-10-CM | POA: Insufficient documentation

## 2024-09-02 DIAGNOSIS — F1010 Alcohol abuse, uncomplicated: Secondary | ICD-10-CM | POA: Diagnosis present

## 2024-09-02 LAB — COMPREHENSIVE METABOLIC PANEL WITH GFR
ALT: 30 U/L (ref 0–44)
AST: 16 U/L (ref 15–41)
Albumin: 3.8 g/dL (ref 3.5–5.0)
Alkaline Phosphatase: 92 U/L (ref 38–126)
Anion gap: 9 (ref 5–15)
BUN: 8 mg/dL (ref 6–20)
CO2: 22 mmol/L (ref 22–32)
Calcium: 9.3 mg/dL (ref 8.9–10.3)
Chloride: 106 mmol/L (ref 98–111)
Creatinine, Ser: 0.79 mg/dL (ref 0.61–1.24)
GFR, Estimated: >60 mL/min (ref >60)
Glucose, Bld: 79 mg/dL (ref 70–99)
Potassium: 3.8 mmol/L (ref 3.5–5.1)
Sodium: 137 mmol/L (ref 135–145)
Total Bilirubin: 0.9 mg/dL (ref 0.0–1.2)
Total Protein: 7.7 g/dL (ref 6.5–8.1)

## 2024-09-02 LAB — CBC
HCT: 45.5 % (ref 39.0–52.0)
Hemoglobin: 15.9 g/dL (ref 13.0–17.0)
MCH: 30.4 pg (ref 26.0–34.0)
MCHC: 34.9 g/dL (ref 30.0–36.0)
MCV: 87 fL (ref 80.0–100.0)
Platelets: 354 K/uL (ref 150–400)
RBC: 5.23 MIL/uL (ref 4.22–5.81)
RDW: 13.2 % (ref 11.5–15.5)
WBC: 8.7 K/uL (ref 4.0–10.5)
nRBC: 0 % (ref 0.0–0.2)

## 2024-09-02 LAB — HIV ANTIBODY (ROUTINE TESTING W REFLEX): HIV Screen 4th Generation wRfx: NONREACTIVE

## 2024-09-02 MED ORDER — METOCLOPRAMIDE HCL 5 MG/ML IJ SOLN
5.0000 mg | Freq: Once | INTRAMUSCULAR | Status: AC
  Administered 2024-09-02: 5 mg via INTRAMUSCULAR

## 2024-09-02 MED ORDER — METOCLOPRAMIDE HCL 5 MG/ML IJ SOLN
INTRAMUSCULAR | Status: AC
  Filled 2024-09-02: qty 2

## 2024-09-02 MED ORDER — KETOROLAC TROMETHAMINE 30 MG/ML IJ SOLN
INTRAMUSCULAR | Status: AC
  Filled 2024-09-02: qty 1

## 2024-09-02 MED ORDER — KETOROLAC TROMETHAMINE 30 MG/ML IJ SOLN
30.0000 mg | Freq: Once | INTRAMUSCULAR | Status: AC
  Administered 2024-09-02: 30 mg via INTRAMUSCULAR

## 2024-09-02 MED ORDER — DEXAMETHASONE SOD PHOSPHATE PF 10 MG/ML IJ SOLN
10.0000 mg | Freq: Once | INTRAMUSCULAR | Status: AC
  Administered 2024-09-02: 10 mg via INTRAMUSCULAR

## 2024-09-02 NOTE — Discharge Instructions (Signed)
 You have been evaluated today for headache.  You were given medicines for your headache in the clinic today which included a strong NSAID, so do not  take ibuprofen  or other NSAIDS (Aleve , aspirin, naproxen , ibuprofen , goody powder, etc.) for the next 12 hours.  Starting tomorrow, take 600mg  ibuprofen  every 6 hours or tylenol  1,000 every 6 hours as needed for pain.  Avoid areas of loud noise/harsh light and remember to drink plenty of water to stay well hydrated.  Please follow up with your primary care provider for further management of your headaches.  Please seek emergency medical care if you experience worsening or uncontrolled pain, vision changes, recurrent vomiting, difficulty with normal activities, abnormal behavior, difficulty walking, numbness, weakness, or any other concerning symptoms.   STD testing will come back in the next 2 to 3 days.  Staff call if abnormal.  I will also call if your blood work is abnormal.  Continue to avoid drinking alcohol and drink at least 64 ounces of water per day to stay well-hydrated.

## 2024-09-02 NOTE — ED Notes (Signed)
 PCP appt made for Pt  and reviewed date and time with PT at DC.

## 2024-09-02 NOTE — ED Triage Notes (Signed)
 PT has had a HA on RT side of head and head is super tender when touched. Pt wants STV testing. Pt reports tongue is white ,urine foamy .

## 2024-09-02 NOTE — ED Provider Notes (Signed)
 [facility]    CSN: [id number] Arrival date & time: 09/02/24  9076      History   Chief Complaint Chief Complaint  Patient presents with   SEXUALLY TRANSMITTED DISEASE   Headache    HPI Roger Kelly is a 28 y.o. male.   Roger Kelly is a 28 y.o. male presenting for chief complaint of headache that started 6 days ago on August 27, 2024. Headache is localized behind the right eye and the right side of the head and never crosses midline. Pain is currently rated at a 9/10 and is described as throbbing/sharp pain that is worsened by bright lights. History of migraine headaches in high school 10 years ago, never seen by a neurologist. Headaches improved years ago and have returned/become more frequent over the last few months.  He admits to daily alcohol intake over the last several months and wonders if this could be contributing to headaches.  Denies recent head injuries, vision changes, nausea, vomiting, hemoptysis, acid reflux, abdominal pain, fever/chills, neck pain, ear pain, dizziness, paresthesias, weakness, and confusion. Denies tremor, seizure activity, and hallucinations when he does not drink alcohol. No bleeding or abnormal bruising. He has not had alcohol in the last 5 days since headache started.  Taking BC powders for head pain with some temporary relief. Last NSAID dose was yesterday.   He would also like to be tested for STDs today. Denies penile discharge, odor, itching, rashes. No urinary symptoms. No recent STD exposures to his knowledge. Sexually active unprotected with male partners.    Headache   Past Medical History:  Diagnosis Date   Anxiety    Asthma    Seasonal allergies     There are no active problems to display for this patient.   Past Surgical History:  Procedure Laterality Date   NO PAST SURGERIES         Home Medications    Prior to Admission medications   Medication Sig Start Date End Date Taking? Authorizing  Provider  hydrOXYzine  (ATARAX ) 50 MG tablet Take 1 tablet (50 mg total) by mouth every 6 (six) hours as needed for anxiety. 02/21/22   Eudelia Maude SAUNDERS, PA-C  ibuprofen  (ADVIL ) 800 MG tablet Take 1 tablet (800 mg total) by mouth 3 (three) times daily. 04/22/24   Rising, [place], PA-C  albuterol  (PROVENTIL  HFA;VENTOLIN  HFA) 108 (90 BASE) MCG/ACT inhaler Inhale 1-2 puffs into the lungs every 6 (six) hours as needed for wheezing or shortness of breath. 07/31/14 04/05/20  Carlyle Lenis, MD    Family History Family History  Family history unknown: Yes    Social History Social History   Tobacco Use   Smoking status: Never   Smokeless tobacco: Never  Vaping Use   Vaping status: Never Used  Substance Use Topics   Alcohol use: No   Drug use: Yes    Types: Marijuana    Comment: occas     Allergies   Penicillins and Amoxicillin   Review of Systems Review of Systems  Neurological:  Positive for headaches.  Per HPI   Physical Exam Triage Vital Signs ED Triage Vitals [09/02/24 1000]  Encounter Vitals Group     BP 128/86     Girls Systolic BP Percentile      Girls Diastolic BP Percentile      Boys Systolic BP Percentile      Boys Diastolic BP Percentile      Pulse Rate 82     Resp 20  Temp 98 F (36.7 C)     Temp src      SpO2 94 %     Weight      Height      Head Circumference      Peak Flow      Pain Score      Pain Loc      Pain Education      Exclude from Growth Chart    No data found.  Updated Vital Signs BP 128/86   Pulse 82   Temp 98 F (36.7 C)   Resp 20   SpO2 94%   Visual Acuity Right Eye Distance:   Left Eye Distance:   Bilateral Distance:    Right Eye Near:   Left Eye Near:    Bilateral Near:     Physical Exam Vitals and nursing note reviewed.  Constitutional:      Appearance: He is not ill-appearing or toxic-appearing.  HENT:     Head: Normocephalic and atraumatic.     Right Ear: Hearing, tympanic membrane, ear canal and  external ear normal.     Left Ear: Hearing, tympanic membrane, ear canal and external ear normal.     Nose: Nose normal.     Mouth/Throat:     Lips: Pink.     Mouth: Mucous membranes are moist. No injury or oral lesions.     Dentition: Normal dentition.     Tongue: No lesions.     Pharynx: Oropharynx is clear. Uvula midline. No pharyngeal swelling, oropharyngeal exudate, posterior oropharyngeal erythema, uvula swelling or postnasal drip.     Tonsils: No tonsillar exudate.  Eyes:     General: Lids are normal. Vision grossly intact. Gaze aligned appropriately.     Extraocular Movements: Extraocular movements intact.     Conjunctiva/sclera: Conjunctivae normal.  Neck:     Trachea: Trachea and phonation normal.  Cardiovascular:     Rate and Rhythm: Normal rate and regular rhythm.     Heart sounds: Normal heart sounds, S1 normal and S2 normal.  Pulmonary:     Effort: Pulmonary effort is normal. No respiratory distress.     Breath sounds: Normal breath sounds and air entry.  Musculoskeletal:     Cervical back: Neck supple.  Lymphadenopathy:     Cervical: No cervical adenopathy.  Skin:    General: Skin is warm and dry.     Capillary Refill: Capillary refill takes less than 2 seconds.     Findings: No rash.  Neurological:     General: No focal deficit present.     Mental Status: He is alert and oriented to person, place, and time. Mental status is at baseline.     GCS: GCS eye subscore is 4. GCS verbal subscore is 5. GCS motor subscore is 6.     Cranial Nerves: Cranial nerves 2-12 are intact. No dysarthria or facial asymmetry.     Sensory: Sensation is intact.     Motor: Motor function is intact. No weakness, tremor, abnormal muscle tone or pronator drift.     Coordination: Coordination is intact. Romberg sign negative. Coordination normal. Finger-Nose-Finger Test normal.     Gait: Gait is intact.     Comments: Strength and sensation intact to bilateral upper and lower extremities  (5/5). Moves all 4 extremities with normal coordination voluntarily. Non-focal neuro exam.   Psychiatric:        Mood and Affect: Mood normal.        Speech: Speech normal.  Behavior: Behavior normal.        Thought Content: Thought content normal.        Judgment: Judgment normal.      UC Treatments / Results  Labs (all labs ordered are listed, but only abnormal results are displayed) Labs Reviewed  CBC  COMPREHENSIVE METABOLIC PANEL WITH GFR  RPR  HIV ANTIBODY (ROUTINE TESTING W REFLEX)  CYTOLOGY, (ORAL, ANAL, URETHRAL) ANCILLARY ONLY    EKG   Radiology No results found.  Procedures Procedures (including critical care time)  Medications Ordered in UC Medications  ketorolac (TORADOL) 30 MG/ML injection 30 mg (30 mg Intramuscular Given 09/02/24 1132)  metoCLOPramide (REGLAN) injection 5 mg (5 mg Intramuscular Given 09/02/24 1133)  dexamethasone (DECADRON) injection 10 mg (10 mg Intramuscular Given 09/02/24 1132)    Initial Impression / Assessment and Plan / UC Course  I have reviewed the triage vital signs and the nursing notes.  Pertinent labs & imaging results that were available during my care of the patient were reviewed by me and considered in my medical decision making (see chart for details).   1. Migraine without aura and without status migrainosus, not intractable Evaluation suggests migraine type headache.   Neurologic exam without focal deficit, patient intact to baseline.  Low suspicion for acute intracranial abnormality.   Nursing administered headache cocktail in clinic for headache. Patient reevaluated 15-20 minutes after medication administration and he reports improvement in head pain (5/10 now).   May use OTC medications as needed for further head pain relief, no NSAIDs for 24 hours due to ketorolac.   Encouraged to drink plenty of fluids to stay well hydrated.  Recommend follow-up with neurology via referral from new PCP.  RN was able  to help patient get appointment to establish care with a new PCP who will then place referral to neurology for further evaluation due to insurance.   2. Excessive drinking alcohol CBC/CMP pending to evaluate for organ dysfunction/electrolyte imbalances contributing to ongoing headaches in the setting of heavy alcohol intake. Encouraged patient to continue alcohol cessation and increase fluid intake to maintain hydration. No signs of acute alcohol withdrawal today. Neurologically intact to baseline.   3. Screening for STD STI labs pending, will notify patient of positive results and treat accordingly per protocol when labs result.  Patient would like HIV and syphilis testing today.   Patient to avoid sexual intercourse until screening testing comes back.   Education provided regarding safe sexual practices and patient encouraged to use protection to prevent spread of STIs.    Counseled patient on potential for adverse effects with medications prescribed/recommended today, strict ER and return-to-clinic precautions discussed, patient verbalized understanding.     Final Clinical Impressions(s) / UC Diagnoses   Final diagnoses:  Migraine without aura and without status migrainosus, not intractable  Screening for STD (sexually transmitted disease)  Excessive drinking alcohol     Discharge Instructions      You have been evaluated today for headache.  You were given medicines for your headache in the clinic today which included a strong NSAID, so do not  take ibuprofen  or other NSAIDS (Aleve , aspirin, naproxen , ibuprofen , goody powder, etc.) for the next 12 hours.  Starting tomorrow, take 600mg  ibuprofen  every 6 hours or tylenol  1,000 every 6 hours as needed for pain.  Avoid areas of loud noise/harsh light and remember to drink plenty of water to stay well hydrated.  Please follow up with your primary care provider for further management of your headaches.  Please seek emergency medical  care if you experience worsening or uncontrolled pain, vision changes, recurrent vomiting, difficulty with normal activities, abnormal behavior, difficulty walking, numbness, weakness, or any other concerning symptoms.   STD testing will come back in the next 2 to 3 days.  Staff call if abnormal.  I will also call if your blood work is abnormal.  Continue to avoid drinking alcohol and drink at least 64 ounces of water per day to stay well-hydrated.      ED Prescriptions   None    PDMP not reviewed this encounter.   Enedelia Dorna HERO, [place] 09/09/24 2219

## 2024-09-03 LAB — RPR: RPR Ser Ql: NONREACTIVE

## 2024-09-03 LAB — CYTOLOGY, (ORAL, ANAL, URETHRAL) ANCILLARY ONLY
Chlamydia: NEGATIVE
Comment: NEGATIVE
Comment: NEGATIVE
Comment: NORMAL
Neisseria Gonorrhea: NEGATIVE
Trichomonas: NEGATIVE

## 2024-10-16 ENCOUNTER — Ambulatory Visit: Payer: Self-pay | Admitting: Family Medicine

## 2024-10-22 ENCOUNTER — Ambulatory Visit (HOSPITAL_COMMUNITY)

## 2024-10-23 ENCOUNTER — Encounter (HOSPITAL_COMMUNITY): Payer: Self-pay | Admitting: Emergency Medicine

## 2024-10-23 ENCOUNTER — Ambulatory Visit (HOSPITAL_COMMUNITY)
Admission: EM | Admit: 2024-10-23 | Discharge: 2024-10-23 | Disposition: A | Discharge status: Home / Self Care | Attending: Nurse Practitioner | Admitting: Nurse Practitioner

## 2024-10-23 DIAGNOSIS — G43801 Other migraine, not intractable, with status migrainosus: Secondary | ICD-10-CM | POA: Diagnosis not present

## 2024-10-23 DIAGNOSIS — T148XXA Other injury of unspecified body region, initial encounter: Secondary | ICD-10-CM

## 2024-10-23 MED ORDER — KETOROLAC TROMETHAMINE 30 MG/ML IJ SOLN
30.0000 mg | Freq: Once | INTRAMUSCULAR | Status: AC
  Administered 2024-10-23: 30 mg via INTRAMUSCULAR

## 2024-10-23 MED ORDER — DEXAMETHASONE SOD PHOSPHATE PF 10 MG/ML IJ SOLN
10.0000 mg | Freq: Once | INTRAMUSCULAR | Status: AC
  Administered 2024-10-23: 10 mg via INTRAMUSCULAR

## 2024-10-23 MED ORDER — KETOROLAC TROMETHAMINE 30 MG/ML IJ SOLN
INTRAMUSCULAR | Status: AC
  Filled 2024-10-23: qty 1

## 2024-10-23 NOTE — ED Provider Notes (Signed)
 [facility]    CSN: [id number] Arrival date & time: 10/23/24  9173      History   Chief Complaint Chief Complaint  Patient presents with   Headache    HPI Roger Kelly is a 28 y.o. male.   Patient presents today with 5 to 6-day history of migraine.  Reports the pain is all over his head.  He denies aura, vision changes, nausea, or vomiting.  No photophobia, phonophobia, or effect on social functioning.  No confusion, gait disturbance or ataxia, fever, cough, congestion, or sore throat.  Reports history of migraines since high school.  Has been taking ibuprofen  which helps temporarily with the pain.  Reports is not the worst headache of his life.  Reports symptoms began after he stopped wearing glasses.  He does not have an appointment scheduled with his eye doctor.  Patient is also concerned about possible pulled muscle in his right shoulder.  Reports he lifts heavy objects at work and pain began around then.  Reports he has sharp pain in his right shoulder and right chest when he breathes or when he moves a certain way.  No swelling, bruising, or redness.  Has tried ice, stretches, and wife's oxycodone  with minimal temporary improvement.  Of note, patient was seen for same migraine a couple of months ago, was treated in urgent care with Toradol  and Decadron  and reports headache went away.  He was schedule an appoint with a primary care provider but missed the appointment.    Past Medical History:  Diagnosis Date   Anxiety    Asthma    Seasonal allergies     There are no active problems to display for this patient.   Past Surgical History:  Procedure Laterality Date   NO PAST SURGERIES         Home Medications    Prior to Admission medications  Medication Sig Start Date End Date Taking? Authorizing Provider  albuterol  (PROVENTIL  HFA;VENTOLIN  HFA) 108 (90 BASE) MCG/ACT inhaler Inhale 1-2 puffs into the lungs every 6 (six) hours as needed for wheezing  or shortness of breath. 07/31/14 04/05/20  Carlyle Lenis, MD    Family History Family History  Family history unknown: Yes    Social History Social History[1]   Allergies   Penicillins and Amoxicillin   Review of Systems Review of Systems Per HPI  Physical Exam Triage Vital Signs ED Triage Vitals  Encounter Vitals Group     BP 10/23/24 0907 123/82     Girls Systolic BP Percentile --      Girls Diastolic BP Percentile --      Boys Systolic BP Percentile --      Boys Diastolic BP Percentile --      Pulse Rate 10/23/24 0907 94     Resp 10/23/24 0907 16     Temp 10/23/24 0907 (!) 100.4 F (38 C)     Temp Source 10/23/24 0907 Oral     SpO2 10/23/24 0907 96 %     Weight --      Height --      Head Circumference --      Peak Flow --      Pain Score 10/23/24 0906 8     Pain Loc --      Pain Education --      Exclude from Growth Chart --    No data found.  Updated Vital Signs BP 123/82 (BP Location: Left Arm)   Pulse 94   Temp ROLLEN)  100.4 F (38 C) (Oral)   Resp 16   SpO2 96%   Temp recehck 100  Visual Acuity Right Eye Distance:   Left Eye Distance:   Bilateral Distance:    Right Eye Near:   Left Eye Near:    Bilateral Near:     Physical Exam Vitals and nursing note reviewed.  Constitutional:      General: He is not in acute distress.    Appearance: Normal appearance. He is not ill-appearing, toxic-appearing or diaphoretic.      Comments: Pain reproducible with palpation over right scapula; full ROM to right upper extremity and distal extremity is neurovascularly intact.  HENT:     Head: Normocephalic and atraumatic.     Right Ear: Tympanic membrane, ear canal and external ear normal. There is no impacted cerumen.     Left Ear: Tympanic membrane, ear canal and external ear normal.     Nose: Nose normal. No congestion or rhinorrhea.     Mouth/Throat:     Mouth: Mucous membranes are moist.     Pharynx: Oropharynx is clear. No posterior oropharyngeal  erythema.  Eyes:     General: No scleral icterus.    Extraocular Movements: Extraocular movements intact.     Pupils: Pupils are equal, round, and reactive to light.  Cardiovascular:     Rate and Rhythm: Normal rate and regular rhythm.     Heart sounds: No murmur heard. Pulmonary:     Effort: Pulmonary effort is normal. No respiratory distress.     Breath sounds: Normal breath sounds. No wheezing, rhonchi or rales.  Abdominal:     General: Abdomen is flat. Bowel sounds are normal. There is no distension.     Palpations: Abdomen is soft.     Tenderness: There is no abdominal tenderness. There is no right CVA tenderness, left CVA tenderness or guarding.  Musculoskeletal:     Cervical back: Normal range of motion and neck supple. No rigidity or tenderness.  Lymphadenopathy:     Cervical: No cervical adenopathy.  Skin:    General: Skin is warm and dry.     Capillary Refill: Capillary refill takes less than 2 seconds.     Coloration: Skin is not jaundiced or pale.     Findings: No erythema.  Neurological:     Mental Status: He is alert and oriented to person, place, and time.     Cranial Nerves: Cranial nerves 2-12 are intact. No cranial nerve deficit.     Sensory: Sensation is intact.     Motor: No weakness.     Coordination: Coordination is intact. Coordination normal. Heel to [facility] Test normal. Rapid alternating movements normal.     Gait: Gait is intact. Gait normal.  Psychiatric:        Behavior: Behavior is cooperative.      UC Treatments / Results  Labs (all labs ordered are listed, but only abnormal results are displayed) Labs Reviewed - No data to display  EKG   Radiology No results found.  Procedures Procedures (including critical care time)  Medications Ordered in UC Medications  ketorolac  (TORADOL ) 30 MG/ML injection 30 mg (30 mg Intramuscular Given 10/23/24 0946)  dexamethasone  (DECADRON ) injection 10 mg (10 mg Intramuscular Given 10/23/24 0946)     Initial Impression / Assessment and Plan / UC Course  I have reviewed the triage vital signs and the nursing notes.  Pertinent labs & imaging results that were available during my care of the patient were reviewed  by me and considered in my medical decision making (see chart for details).   Patient is a well-appearing 28 year old male presenting today for migraine and right shoulder pain.  Vital signs are stable, patient is overall well-appearing and nontoxic.  Neurologically, he is intact.  Migraine treated with Toradol  30 mg and Decadron  10 mg IM in urgent care today.  I recommended follow-up with primary care provider to discuss migraine prevention as well as optometrist for new glasses prescription.  Regarding right shoulder pain, this is likely a muscle strain as it is reproducible on exam.  We discussed injections he is getting today for the migraine will likely help with the pain.  He can continue ice, light range of motion and stretching exercises for the pain.  I recommended he follow-up with primary care provider if symptoms do not improve with this treatment.  Work excuse was provided today.  The patient was given the opportunity to ask questions.  All questions answered to their satisfaction.  The patient is in agreement to this plan.   Final Clinical Impressions(s) / UC Diagnoses   Final diagnoses:  Other migraine with status migrainosus, not intractable  Muscle strain     Discharge Instructions      We gave you 2 injections today of an anti-inflammatory and steroid medicine to help with the headache.  Please do not take any ibuprofen  for the next 48 hours.  You can take Tylenol  500 to 1000 mg every 6 hours for pain.  Please follow-up with an eye doctor and a primary care provider for further evaluation and management of your migraines.  Regarding the pain in your right shoulder, this is very likely a muscle strain.  Continue ice, rest, light range of motion and stretching  exercises.  Seek care if symptoms do not improve with treatment.     ED Prescriptions   None    PDMP not reviewed this encounter.     [1]  Social History Tobacco Use   Smoking status: Never   Smokeless tobacco: Never  Vaping Use   Vaping status: Never Used  Substance Use Topics   Alcohol use: No   Drug use: Yes    Types: Marijuana    Comment: occas     Chandra Harlene LABOR, NP 10/23/24 1037

## 2024-10-23 NOTE — Discharge Instructions (Signed)
 We gave you 2 injections today of an anti-inflammatory and steroid medicine to help with the headache.  Please do not take any ibuprofen  for the next 48 hours.  You can take Tylenol  500 to 1000 mg every 6 hours for pain.  Please follow-up with an eye doctor and a primary care provider for further evaluation and management of your migraines.  Regarding the pain in your right shoulder, this is very likely a muscle strain.  Continue ice, rest, light range of motion and stretching exercises.  Seek care if symptoms do not improve with treatment.

## 2024-10-23 NOTE — ED Triage Notes (Signed)
 Pt reports been having headache since was seen here in October. Missed appt had set up with PCP. Reports right ear pain for several days as well. Took ibuprofen  and one of his wife's oxycodone  for a pulled muscle and hasn't tried anything for headaches.

## 2024-11-10 ENCOUNTER — Ambulatory Visit (HOSPITAL_COMMUNITY)
